# Patient Record
Sex: Female | Born: 1960
Health system: Southern US, Community
[De-identification: ages and names within clinical notes are randomized; demographics above are authoritative.]

## PROBLEM LIST (undated history)

## (undated) DIAGNOSIS — Z720 Tobacco use: Secondary | ICD-10-CM

## (undated) DIAGNOSIS — I10 Essential (primary) hypertension: Secondary | ICD-10-CM

## (undated) DIAGNOSIS — C44519 Basal cell carcinoma of skin of other part of trunk: Secondary | ICD-10-CM

## (undated) DIAGNOSIS — R51 Headache: Secondary | ICD-10-CM

## (undated) DIAGNOSIS — R519 Headache, unspecified: Secondary | ICD-10-CM

## (undated) DIAGNOSIS — R002 Palpitations: Secondary | ICD-10-CM

## (undated) DIAGNOSIS — M1990 Unspecified osteoarthritis, unspecified site: Secondary | ICD-10-CM

## (undated) DIAGNOSIS — R42 Dizziness and giddiness: Secondary | ICD-10-CM

## (undated) DIAGNOSIS — R059 Cough, unspecified: Secondary | ICD-10-CM

## (undated) DIAGNOSIS — L309 Dermatitis, unspecified: Secondary | ICD-10-CM

## (undated) DIAGNOSIS — R05 Cough: Secondary | ICD-10-CM

## (undated) HISTORY — DX: Dizziness and giddiness: R42

## (undated) HISTORY — DX: Cough: R05

## (undated) HISTORY — DX: Headache, unspecified: R51.9

## (undated) HISTORY — DX: Basal cell carcinoma of skin of other part of trunk: C44.519

## (undated) HISTORY — DX: Essential (primary) hypertension: I10

## (undated) HISTORY — DX: Tobacco use: Z72.0

## (undated) HISTORY — DX: Palpitations: R00.2

## (undated) HISTORY — DX: Unspecified osteoarthritis, unspecified site: M19.90

## (undated) HISTORY — PX: LAPAROSCOPY: SHX197

## (undated) HISTORY — PX: ELECTROCARDIOGRAM: SHX264

## (undated) HISTORY — DX: Headache: R51

## (undated) HISTORY — DX: Cough, unspecified: R05.9

## (undated) HISTORY — DX: Dermatitis, unspecified: L30.9

## (undated) HISTORY — PX: KNEE ARTHROSCOPY: SUR90

---

## 1997-11-23 ENCOUNTER — Other Ambulatory Visit: Admission: RE | Admit: 1997-11-23 | Discharge: 1997-11-23 | Payer: Self-pay | Admitting: Obstetrics and Gynecology

## 1998-11-29 ENCOUNTER — Other Ambulatory Visit: Admission: RE | Admit: 1998-11-29 | Discharge: 1998-11-29 | Payer: Self-pay | Admitting: Obstetrics and Gynecology

## 1999-03-18 ENCOUNTER — Ambulatory Visit (HOSPITAL_COMMUNITY): Admission: RE | Admit: 1999-03-18 | Discharge: 1999-03-18 | Payer: Self-pay | Admitting: Obstetrics and Gynecology

## 1999-12-16 ENCOUNTER — Other Ambulatory Visit: Admission: RE | Admit: 1999-12-16 | Discharge: 1999-12-16 | Payer: Self-pay | Admitting: Obstetrics and Gynecology

## 2001-01-07 ENCOUNTER — Other Ambulatory Visit: Admission: RE | Admit: 2001-01-07 | Discharge: 2001-01-07 | Payer: Self-pay | Admitting: Obstetrics and Gynecology

## 2002-03-25 ENCOUNTER — Other Ambulatory Visit: Admission: RE | Admit: 2002-03-25 | Discharge: 2002-03-25 | Payer: Self-pay | Admitting: Obstetrics and Gynecology

## 2003-02-11 ENCOUNTER — Ambulatory Visit (HOSPITAL_COMMUNITY): Admission: RE | Admit: 2003-02-11 | Discharge: 2003-02-11 | Payer: Self-pay | Admitting: Internal Medicine

## 2003-02-11 ENCOUNTER — Encounter: Payer: Self-pay | Admitting: Internal Medicine

## 2003-04-01 ENCOUNTER — Other Ambulatory Visit: Admission: RE | Admit: 2003-04-01 | Discharge: 2003-04-01 | Payer: Self-pay | Admitting: Obstetrics and Gynecology

## 2004-05-02 ENCOUNTER — Other Ambulatory Visit: Admission: RE | Admit: 2004-05-02 | Discharge: 2004-05-02 | Payer: Self-pay | Admitting: Obstetrics and Gynecology

## 2004-07-13 ENCOUNTER — Encounter: Payer: Self-pay | Admitting: Internal Medicine

## 2004-08-30 ENCOUNTER — Emergency Department (HOSPITAL_COMMUNITY): Admission: EM | Admit: 2004-08-30 | Discharge: 2004-08-30 | Payer: Self-pay | Admitting: *Deleted

## 2004-09-01 ENCOUNTER — Emergency Department (HOSPITAL_COMMUNITY): Admission: EM | Admit: 2004-09-01 | Discharge: 2004-09-01 | Payer: Self-pay | Admitting: Emergency Medicine

## 2004-09-02 ENCOUNTER — Ambulatory Visit: Payer: Self-pay | Admitting: Internal Medicine

## 2004-09-02 ENCOUNTER — Ambulatory Visit (HOSPITAL_COMMUNITY): Admission: RE | Admit: 2004-09-02 | Discharge: 2004-09-02 | Payer: Self-pay | Admitting: Internal Medicine

## 2004-09-05 ENCOUNTER — Ambulatory Visit: Payer: Self-pay | Admitting: Internal Medicine

## 2010-12-20 ENCOUNTER — Encounter: Payer: Self-pay | Admitting: Internal Medicine

## 2010-12-23 ENCOUNTER — Encounter: Payer: Self-pay | Admitting: Internal Medicine

## 2010-12-26 ENCOUNTER — Institutional Professional Consult (permissible substitution): Payer: Self-pay | Admitting: Internal Medicine

## 2011-02-06 ENCOUNTER — Encounter: Payer: Self-pay | Admitting: Internal Medicine

## 2011-02-06 ENCOUNTER — Ambulatory Visit (INDEPENDENT_AMBULATORY_CARE_PROVIDER_SITE_OTHER): Payer: Self-pay | Admitting: Internal Medicine

## 2011-02-06 DIAGNOSIS — F17200 Nicotine dependence, unspecified, uncomplicated: Secondary | ICD-10-CM | POA: Insufficient documentation

## 2011-02-06 DIAGNOSIS — Z0000 Encounter for general adult medical examination without abnormal findings: Secondary | ICD-10-CM

## 2011-02-06 DIAGNOSIS — I10 Essential (primary) hypertension: Secondary | ICD-10-CM

## 2011-02-06 MED ORDER — AMLODIPINE BESYLATE 2.5 MG PO TABS: 2.5000 mg | ORAL_TABLET | Freq: Two times a day (BID) | ORAL | Status: DC

## 2011-02-06 NOTE — Progress Notes (Signed)
HPI Patient is a 50 year old who was referred for evaluation of hypertension She was seen a [facility].  BP 170s/  Placed on 12.5 HCTZ. Has BP cuff at home  Having problems with it   BP was 180/ at one point BP has been up over several visits int past Notes rare dizziness with heat.  Occasional palpitations  Short lived. Allergies  Allergen Reactions  . Cannabinoids   . Tetracyclines & Related     Current Outpatient Prescriptions  Medication Sig Dispense Refill  . B Complex-C (SUPER B COMPLEX PO) Take by mouth. daily       . Calcium Carbonate-Vit D-Min (CALTRATE PLUS PO) Take by mouth.        . clonazePAM (KLONOPIN) 0.5 MG tablet Pt takes as needed      . Fish Oil OIL        . hydrochlorothiazide (,MICROZIDE/HYDRODIURIL,) 12.5 MG capsule 1 tab daily        Past Medical History  Diagnosis Date  . Hypertension   . Dizziness - light-headed   . Chest pain   . Palpitations   . Tobacco abuse     No ready to quit  . Cough     Past Surgical History  Procedure Date  . Electrocardiogram     Family History  Problem Relation Age of Onset  . Hypertension Mother   . Hypertension Sister     History   Social History  . Marital Status: Married    Spouse Name: N/A    Number of Children: N/A  . Years of Education: N/A   Occupational History  . Works for Adult nurse    Social History Main Topics  . Smoking status: Current Everyday Smoker -- 0.5 packs/day  . Smokeless tobacco: Not on file  . Alcohol Use: 1.5 - 2.0 oz/week    3-4 drink(s) per week  . Drug Use: Not on file  . Sexually Active: Not on file   Other Topics Concern  . Not on file   Social History Narrative   Married    Review of Systems:  All systems reviewed.  They are negative to the above problem except as previously stated. Worried about wt loss was 115  Now 109  Blames on heat. Vital Signs: BP 162/104  Pulse 83  Ht 5\' 4"  (1.626 m)  Wt 109 lb (49.442 kg)  BMI 18.71  kg/m2  Physical Exam  Patient is in NAD HEENT:  Normocephalic, atraumatic. EOMI, PERRLA.  Neck: JVP is normal. No thyromegaly. No bruits.  Lungs: clear to auscultation. No rales no wheezes.  Heart: Regular rate and rhythm. Normal S1, S2. No S3.   No significant murmurs. PMI not displaced.  Abdomen:  Supple, nontender. Normal bowel sounds. No masses. No hepatomegaly.  Extremities:   Good distal pulses throughout. No lower extremity edema.  Musculoskeletal :moving all extremities.  Neuro:   alert and oriented x3.  CN II-XII grossly intact.  EKG:  NSR.  85 bpm.  Short PR.   Assessment and Plan:

## 2011-02-06 NOTE — Assessment & Plan Note (Signed)
Will see if she has had fasting lipids.  Will need to be followed.

## 2011-02-06 NOTE — Assessment & Plan Note (Signed)
Will start Norvasc 2.5.  Can increase as neede Continue HCTZ for now Will get the additional labs she had at [facility]

## 2011-02-06 NOTE — Patient Instructions (Signed)
Start Norvasc 2.5mg  2 times per day  See Dr.Ross in 4 weeks.

## 2011-02-06 NOTE — Assessment & Plan Note (Signed)
Counselled on quitting. 

## 2011-03-06 ENCOUNTER — Ambulatory Visit (INDEPENDENT_AMBULATORY_CARE_PROVIDER_SITE_OTHER): Payer: BC Managed Care – PPO | Admitting: Internal Medicine

## 2011-03-06 ENCOUNTER — Encounter: Payer: Self-pay | Admitting: Internal Medicine

## 2011-03-06 DIAGNOSIS — I10 Essential (primary) hypertension: Secondary | ICD-10-CM

## 2011-03-06 DIAGNOSIS — F17200 Nicotine dependence, unspecified, uncomplicated: Secondary | ICD-10-CM

## 2011-03-06 DIAGNOSIS — Z0000 Encounter for general adult medical examination without abnormal findings: Secondary | ICD-10-CM

## 2011-03-06 NOTE — Progress Notes (Signed)
HPI Patient is a 50 year old who I saw back in July  She was referred for hypertension.  I added low dose norvasc to her regimen. Feeling good.  Hasnt taken BP.  Breathing ok  Active.  No edema.   Allergies  Allergen Reactions  . Cannabinoids   . Tetracyclines & Related     Current Outpatient Prescriptions  Medication Sig Dispense Refill  . amLODipine (NORVASC) 2.5 MG tablet Take 2.5 mg by mouth daily.        . B Complex-C (SUPER B COMPLEX PO) Take by mouth. daily       . Calcium Carbonate-Vit D-Min (CALTRATE PLUS PO) Take by mouth.        . clonazePAM (KLONOPIN) 0.5 MG tablet Pt takes as needed      . Fish Oil OIL          Past Medical History  Diagnosis Date  . Hypertension   . Dizziness - light-headed   . Chest pain   . Palpitations   . Tobacco abuse     No ready to quit  . Cough     Past Surgical History  Procedure Date  . Electrocardiogram     Family History  Problem Relation Age of Onset  . Hypertension Mother   . Hypertension Sister     History   Social History  . Marital Status: Married    Spouse Name: N/A    Number of Children: N/A  . Years of Education: N/A   Occupational History  . Works for Adult nurse    Social History Main Topics  . Smoking status: Current Everyday Smoker -- 0.5 packs/day  . Smokeless tobacco: Not on file  . Alcohol Use: 1.5 - 2.0 oz/week    3-4 drink(s) per week  . Drug Use: Not on file  . Sexually Active: Not on file   Other Topics Concern  . Not on file   Social History Narrative   Married    Review of Systems:  All systems reviewed.  They are negative to the above problem except as previously stated.  Vital Signs: BP 141/92  Pulse 92  Ht 5\' 4"  (1.626 m)  Wt 109 lb (49.442 kg)  BMI 18.71 kg/m2  BP on my check was 126/86  Physical Exam  HEENT:  Normocephalic, atraumatic. EOMI, PERRLA.  Neck: JVP is normal. No thyromegaly. No bruits.  Lungs: clear to auscultation. No rales no wheezes.  Heart:  Regular rate and rhythm. Normal S1, S2. No S3.   No significant murmurs. PMI not displaced.  Abdomen:  Supple, nontender. Normal bowel sounds. No masses. No hepatomegaly.  Extremities:   Good distal pulses throughout. No lower extremity edema.  Musculoskeletal :moving all extremities.  Neuro:   alert and oriented x3.  CN II-XII grossly intact.   Assessment and Plan:

## 2011-03-06 NOTE — Assessment & Plan Note (Signed)
Will need lipids periodically.

## 2011-03-06 NOTE — Assessment & Plan Note (Signed)
BP is better than last visit.  She may need a little more.  I rec that she check BP at home.  Get cuff calibrated at pharmacy. If running greater than 150 over 90 then increase to 2.5 bid amlodipine She would like to f/u at [facility] due to lower copay.  I will be available as needed.

## 2011-03-06 NOTE — Assessment & Plan Note (Signed)
Trying to quit.  Encouraged.  

## 2011-07-21 ENCOUNTER — Ambulatory Visit (INDEPENDENT_AMBULATORY_CARE_PROVIDER_SITE_OTHER): Payer: BC Managed Care – PPO | Admitting: Family Medicine

## 2011-07-21 DIAGNOSIS — I10 Essential (primary) hypertension: Secondary | ICD-10-CM

## 2011-08-18 ENCOUNTER — Ambulatory Visit (INDEPENDENT_AMBULATORY_CARE_PROVIDER_SITE_OTHER): Payer: BC Managed Care – PPO | Admitting: Family Medicine

## 2011-08-18 DIAGNOSIS — M25549 Pain in joints of unspecified hand: Secondary | ICD-10-CM

## 2011-08-18 DIAGNOSIS — M654 Radial styloid tenosynovitis [de Quervain]: Secondary | ICD-10-CM

## 2011-09-27 ENCOUNTER — Telehealth: Payer: Self-pay

## 2011-09-27 ENCOUNTER — Ambulatory Visit (INDEPENDENT_AMBULATORY_CARE_PROVIDER_SITE_OTHER): Payer: BC Managed Care – PPO | Admitting: Family Medicine

## 2011-09-27 ENCOUNTER — Telehealth: Payer: Self-pay | Admitting: Family Medicine

## 2011-09-27 DIAGNOSIS — M542 Cervicalgia: Secondary | ICD-10-CM

## 2011-09-27 DIAGNOSIS — R599 Enlarged lymph nodes, unspecified: Secondary | ICD-10-CM

## 2011-09-27 DIAGNOSIS — R590 Localized enlarged lymph nodes: Secondary | ICD-10-CM

## 2011-09-27 LAB — POCT CBC
Granulocyte percent: 70.5 %G (ref 37–80)
Hemoglobin: 12.9 g/dL (ref 12.2–16.2)
MID (cbc): 0.6 (ref 0–0.9)
MPV: 8.2 fL (ref 0–99.8)
POC MID %: 6.4 %M (ref 0–12)
Platelet Count, POC: 356 10*3/uL (ref 142–424)
RBC: 4.4 M/uL (ref 4.04–5.48)

## 2011-09-27 MED ORDER — AZITHROMYCIN 250 MG PO TABS: ORAL_TABLET | ORAL | Status: AC

## 2011-09-27 MED ORDER — HYDROCODONE-ACETAMINOPHEN 5-325 MG PO TABS: 2.0000 | ORAL_TABLET | Freq: Four times a day (QID) | ORAL | Status: DC | PRN

## 2011-09-27 NOTE — Patient Instructions (Signed)
Take antibiotic as instructed, 1 to 2 lortab up to every 6 hours as needed.  If any fever, rash, or worsening - return to clinic or emergency room.  Recheck at office visit in 10 days.  Lymphadenopathy Lymphadenopathy means "disease of the lymph glands." But the term is usually used to describe swollen or enlarged lymph glands, also called lymph nodes. These are the bean-shaped organs found in many locations including the neck, underarm, and groin. Lymph glands are part of the immune system, which fights infections in your body. Lymphadenopathy can occur in just one area of the body, such as the neck, or it can be generalized, with lymph node enlargement in several areas. The nodes found in the neck are the most common sites of lymphadenopathy. CAUSES  When your immune system responds to germs (such as viruses or bacteria ), infection-fighting cells and fluid build up. This causes the glands to grow in size. This is usually not something to worry about. Sometimes, the glands themselves can become infected and inflamed. This is called lymphadenitis. Enlarged lymph nodes can be caused by many diseases:  Bacterial disease, such as strep throat or a skin infection.   Viral disease, such as a common cold.   Other germs, such as lyme disease, tuberculosis, or sexually transmitted diseases.   Cancers, such as lymphoma (cancer of the lymphatic system) or leukemia (cancer of the white blood cells).   Inflammatory diseases such as lupus or rheumatoid arthritis.   Reactions to medications.  Many of the diseases above are rare, but important. This is why you should see your caregiver if you have lymphadenopathy. SYMPTOMS   Swollen, enlarged lumps in the neck, back of the head or other locations.   Tenderness.   Warmth or redness of the skin over the lymph nodes.   Fever.  DIAGNOSIS  Enlarged lymph nodes are often near the source of infection. They can help healthcare providers diagnose your  illness. For instance:   Swollen lymph nodes around the jaw might be caused by an infection in the mouth.   Enlarged glands in the neck often signal a throat infection.   Lymph nodes that are swollen in more than one area often indicate an illness caused by a virus.  Your caregiver most likely will know what is causing your lymphadenopathy after listening to your history and examining you. Blood tests, x-rays or other tests may be needed. If the cause of the enlarged lymph node cannot be found, and it does not go away by itself, then a biopsy may be needed. Your caregiver will discuss this with you. TREATMENT  Treatment for your enlarged lymph nodes will depend on the cause. Many times the nodes will shrink to normal size by themselves, with no treatment. Antibiotics or other medicines may be needed for infection. Only take over-the-counter or prescription medicines for pain, discomfort or fever as directed by your caregiver. HOME CARE INSTRUCTIONS  Swollen lymph glands usually return to normal when the underlying medical condition goes away. If they persist, contact your health-care provider. He/she might prescribe antibiotics or other treatments, depending on the diagnosis. Take any medications exactly as prescribed. Keep any follow-up appointments made to check on the condition of your enlarged nodes.  SEEK MEDICAL CARE IF:   Swelling lasts for more than two weeks.   You have symptoms such as weight loss, night sweats, fatigue or fever that does not go away.   The lymph nodes are hard, seem fixed to the skin  or are growing rapidly.   Skin over the lymph nodes is red and inflamed. This could mean there is an infection.  SEEK IMMEDIATE MEDICAL CARE IF:   Fluid starts leaking from the area of the enlarged lymph node.   You develop a fever of 102 F (38.9 C) or greater.   Severe pain develops (not necessarily at the site of a large lymph node).   You develop chest pain or shortness of  breath.   You develop worsening abdominal pain.  MAKE SURE YOU:   Understand these instructions.   Will watch your condition.   Will get help right away if you are not doing well or get worse.  Document Released: 04/18/2008 Document Revised: 06/29/2011 Document Reviewed: 04/18/2008 [facility] Patient Information 2012 [place], [place].

## 2011-09-27 NOTE — Progress Notes (Signed)
Subjective:    Patient ID: Kellie Mason, female    DOB: 02/24/1961, 51 y.o.   MRN: [id number]  HPI Kellie Mason is a 51 y.o. female Started 3 nights ago - bumps on left side of neck and back of head. L side only. No fever, no sore throat.  Head and chest cold 2 months ago - feeling fine recently. Fatigue due to pain in neck and hard to sleep on L side.    Tx:  None.  2 cats at home, no recent scratches.   Review of Systems  Constitutional: Negative for fever, chills, activity change and appetite change.  HENT: Positive for neck pain and neck stiffness. Negative for hearing loss, ear pain, congestion, sore throat, facial swelling, rhinorrhea, trouble swallowing, postnasal drip and ear discharge.   Eyes: Negative for photophobia, discharge and itching.  Respiratory: Negative for cough and shortness of breath.   Skin: Negative for rash.  Hematological: Positive for adenopathy.       Objective:   Physical Exam  Constitutional: She is oriented to person, place, and time. She appears well-developed.  HENT:  Head: Normocephalic and atraumatic.  Right Ear: External ear normal.  Left Ear: External ear normal.  Mouth/Throat: Oropharynx is clear and moist. No oropharyngeal exudate.  Eyes: Conjunctivae are normal. Pupils are equal, round, and reactive to light.  Neck: Trachea normal. Neck supple. No mass and no thyromegaly present.    Cardiovascular: Normal rate and regular rhythm.   Pulmonary/Chest: Effort normal.  Lymphadenopathy:    She has cervical adenopathy.  Neurological: She is alert and oriented to person, place, and time.  Skin: Skin is warm and dry.  Psychiatric: She has a normal mood and affect. Her behavior is normal.  able to move neck with difficulty, but painful on L side with LAD.    No supraclavicular, epitrochlear LAD.  No rash or abrasions/lacerations on neck or arm noted.  No wounds.  Results for orders placed in visit on 09/27/11  POCT RAPID STREP A  (OFFICE)      Component Value Range   Rapid Strep A Screen Negative  Negative   POCT CBC      Component Value Range   WBC 9.2  4.6 - 10.2 (K/uL)   Lymph, poc 2.1  0.6 - 3.4    POC LYMPH PERCENT 23.1  10 - 50 (%L)   MID (cbc) 0.6  0 - 0.9    POC MID % 6.4  0 - 12 (%M)   POC Granulocyte 6.5  2 - 6.9    Granulocyte percent 70.5  37 - 80 (%G)   RBC 4.40  4.04 - 5.48 (M/uL)   Hemoglobin 12.9  12.2 - 16.2 (g/dL)   HCT, POC 78.2  95.6 - 47.9 (%)   MCV 92.4  80 - 97 (fL)   MCH, POC 29.3  27 - 31.2 (pg)   MCHC 31.7 (*) 31.8 - 35.4 (g/dL)   RDW, POC 21.3     Platelet Count, POC 356  142 - 424 (K/uL)   MPV 8.2  0 - 99.8 (fL)        Assessment & Plan:  Kellie Mason is a 51 y.o. female  1. Lymphadenopathy with neck pain on left.  DDX viral (including EBV, CMV) vs. Bartonella inf'n as cats at home, but no wounds seen. Start Zpak #1, lortab q6hprn pain,  Heat or ice to area prn, and recheck in 7-10 days if not resolving.  If any  worsening sooner including fever or rash, rtc for eval.  Has tolerated percocet in past without rash.  Advised if any rash or new side effects with lortab, stop med and rtc or er.

## 2011-09-27 NOTE — Telephone Encounter (Signed)
Norco rx printed and not given to patient, ok per Dr. Neva Seat to call into [place] [facility] on [address].  Done.

## 2011-10-06 ENCOUNTER — Encounter: Payer: Self-pay | Admitting: Family Medicine

## 2011-10-06 ENCOUNTER — Ambulatory Visit (INDEPENDENT_AMBULATORY_CARE_PROVIDER_SITE_OTHER): Payer: BC Managed Care – PPO | Admitting: Family Medicine

## 2011-10-06 VITALS — BP 141/87 | HR 93 | Temp 96.8°F | Resp 18 | Ht 64.5 in | Wt 112.0 lb

## 2011-10-06 DIAGNOSIS — L309 Dermatitis, unspecified: Secondary | ICD-10-CM

## 2011-10-06 DIAGNOSIS — L259 Unspecified contact dermatitis, unspecified cause: Secondary | ICD-10-CM

## 2011-10-06 DIAGNOSIS — R599 Enlarged lymph nodes, unspecified: Secondary | ICD-10-CM

## 2011-10-06 DIAGNOSIS — R591 Generalized enlarged lymph nodes: Secondary | ICD-10-CM

## 2011-10-06 DIAGNOSIS — M6740 Ganglion, unspecified site: Secondary | ICD-10-CM

## 2011-10-06 MED ORDER — CLOBETASOL PROPIONATE 0.05 % EX CREA: TOPICAL_CREAM | Freq: Two times a day (BID) | CUTANEOUS | Status: DC

## 2011-10-06 NOTE — Progress Notes (Signed)
  Subjective:    Patient ID: Donzetta Matters, female    DOB: [birth year], 51 y.o.   MRN: [id number]  HPI LEEANN BADY is a 51 y.o. female Follow up  Lymphadenopathy with neck pain on left - evaluated on 09/27/11.  DDX viral (including EBV, CMV) vs. Bartonella inf'n as cats at home, but no wounds seen. Started Zpak #1, lortab q6hprn pain,  Heat or ice to area prn.  Bump went down on neck - resolved totally sice yesterday,  Soreness almost completely gone.  No fever.  R ganglion cyst. - still small amount of swelling after incised by hand.  Hx eczema - dermatologist - Dr. Londell Moh or Yetta Barre at [facility].  Uses clobetasol proprionate 0.05% in am, and 3 nights per week during flair.  Has 60 gram tube.    Review of Systems  Constitutional: Negative for fever and chills.  HENT: Negative for neck pain and neck stiffness.   Skin:       Dry skin on hands feet.  Hematological: Negative for adenopathy.       Objective:   Physical Exam  Constitutional: She appears well-developed and well-nourished.  HENT:  Head: Normocephalic.  Pulmonary/Chest: Effort normal.  Musculoskeletal:       Hands: Lymphadenopathy:    She has no cervical adenopathy.  Skin:       Dry, scale with few cracks in skin volar wrist/hands,  And lateral foot.  No surrounding erythema.    No lymphadenopathy or focal ttp. In neck good rom.      Assessment & Plan:  RHIANA MORASH is a 51 y.o. female  Lymphadenopathy - resolved.  rtc if sx's recur.  R hand gangion cyst - incised 3 weeks ago.  still some residual swelling - plans on emailing Dr. Melvyn Novas to follow up.  Eczema - hands, feet.  Dyshidrosis?  Frequent hand washing at work - 15 times.  lubriderm or eucerin throughout day and overnight.  Clobetasol 0.05% BID prn - short courses only to not exceed 5-7 days, or follow up with derm.  Discussed risk hyperpigmentation with steroid use.RTC precautions.  At end of visit - admitted to insomnia .  Sleep hygiene  discussed, can follow up to discuss in more detail and needs HTN follow up next few months.

## 2011-11-09 NOTE — Telephone Encounter (Signed)
test

## 2012-01-08 ENCOUNTER — Other Ambulatory Visit: Payer: Self-pay | Admitting: Family Medicine

## 2012-01-19 ENCOUNTER — Ambulatory Visit (INDEPENDENT_AMBULATORY_CARE_PROVIDER_SITE_OTHER): Payer: PRIVATE HEALTH INSURANCE | Admitting: Family Medicine

## 2012-01-19 ENCOUNTER — Encounter: Payer: Self-pay | Admitting: Family Medicine

## 2012-01-19 VITALS — BP 136/83 | HR 81 | Temp 97.2°F | Resp 16 | Ht 64.5 in | Wt 111.2 lb

## 2012-01-19 DIAGNOSIS — R42 Dizziness and giddiness: Secondary | ICD-10-CM

## 2012-01-19 DIAGNOSIS — I10 Essential (primary) hypertension: Secondary | ICD-10-CM

## 2012-01-19 DIAGNOSIS — J301 Allergic rhinitis due to pollen: Secondary | ICD-10-CM

## 2012-01-19 DIAGNOSIS — R05 Cough: Secondary | ICD-10-CM

## 2012-01-19 DIAGNOSIS — R059 Cough, unspecified: Secondary | ICD-10-CM

## 2012-01-19 LAB — CBC WITH DIFFERENTIAL/PLATELET
Eosinophils Relative: 1 % (ref 0–5)
HCT: 38.3 % (ref 36.0–46.0)
Hemoglobin: 13.1 g/dL (ref 12.0–15.0)
Lymphocytes Relative: 27 % (ref 12–46)
Lymphs Abs: 2.1 10*3/uL (ref 0.7–4.0)
MCH: 30.5 pg (ref 26.0–34.0)
MCV: 89.3 fL (ref 78.0–100.0)
Monocytes Absolute: 0.7 10*3/uL (ref 0.1–1.0)
Monocytes Relative: 8 % (ref 3–12)
Platelets: 426 10*3/uL — ABNORMAL HIGH (ref 150–400)
RBC: 4.29 MIL/uL (ref 3.87–5.11)
WBC: 8.1 10*3/uL (ref 4.0–10.5)

## 2012-01-19 LAB — BASIC METABOLIC PANEL
CO2: 27 mEq/L (ref 19–32)
Chloride: 102 mEq/L (ref 96–112)
Potassium: 4 mEq/L (ref 3.5–5.3)
Sodium: 140 mEq/L (ref 135–145)

## 2012-01-19 MED ORDER — HYDROCHLOROTHIAZIDE 12.5 MG PO CAPS: 12.5000 mg | ORAL_CAPSULE | ORAL | Status: DC

## 2012-01-19 MED ORDER — ALBUTEROL SULFATE HFA 108 (90 BASE) MCG/ACT IN AERS: 2.0000 | INHALATION_SPRAY | Freq: Four times a day (QID) | RESPIRATORY_TRACT | Status: DC | PRN

## 2012-01-19 MED ORDER — AMLODIPINE BESYLATE 2.5 MG PO TABS: 2.5000 mg | ORAL_TABLET | Freq: Every day | ORAL | Status: DC

## 2012-01-19 NOTE — Progress Notes (Signed)
  Subjective:    Patient ID: Kellie Mason, female    DOB: 10/23/1960, 51 y.o.   MRN: [id number]  HPI HTN - no outside BPs.  Dizzy at times at work - 2-3 mornings per week - few months.  Not climpbing ladders.  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  Lasts 20-30 seconds.   Has not checked pressure during episodes.  Drinking plenty of water during day. No dark urine, normal uop.  Borderline anemia in past.  Hgb 12.9 on March 6th. Eating breakfast QD.  TSH wnl 5/12.  Cough - past 2-3 weeks, but improving.  No fever.  Dust in warehouse with inventory.  Wheeze in past.  No hx asthma.  Slight sore throat past few weeks - better now.  Tx: alka seltzer sever cold.     Review of Systems  Constitutional: Negative for fatigue and unexpected weight change.       Down 3 pounds with work environment.    Respiratory: Negative for chest tightness and shortness of breath.   Cardiovascular: Negative for chest pain, palpitations and leg swelling.  Gastrointestinal: Negative for abdominal pain and blood in stool.  Neurological: Positive for dizziness. Negative for syncope, light-headedness and headaches.       Per hpi.       Objective:   Physical Exam  Constitutional: She is oriented to person, place, and time. She appears well-developed.       Thin, but not cachectic.  HENT:  Head: Normocephalic and atraumatic.  Eyes: Conjunctivae and EOM are normal. Pupils are equal, round, and reactive to light.  Neck: Carotid bruit is not present.  Cardiovascular: Normal rate, regular rhythm, normal heart sounds and intact distal pulses.   Pulmonary/Chest: Effort normal and breath sounds normal.  Abdominal: Soft. She exhibits no pulsatile midline mass. There is no tenderness.  Neurological: She is alert and oriented to person, place, and time.  Skin: Skin is warm and dry.  Psychiatric: She has a normal mood and affect. Her behavior is normal.          Assessment & Plan:  Kellie Mason is a 51 y.o.  female   Htn with episodes of dizziness. Cont same meds for now as seems to be associated with heat of warehouse, Check outside BP's - if less than 110 during episodes, stop norvasc and rtc.  Recheck in next 6-8 weeks with outside BP's. If dizziness not resolved  Discussed increase in calories, and complex carbs during workday to maintain during workday.  Check BMP, TSH, CBC.  Cough, ST - improved. Suspect allergic component with dust exposure Trial of allegra or zyrtec otc QD, and only if needed - proair for wheeze.    rtc precautions.  Patient Instructions  Try zyrtec or allegra - 1 per day for allergies.  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 If wheezing - can use inhaler as prescribed.  Increase calories during workday, maintain adequate hydration, and if dizziness - check blood pressure.  If these symptoms persist - recheck in next 6 weeks, sooner if worse. Your should receive a call or letter about your lab results within the next week to 10 days.  Return to the clinic or go to the nearest emergency room if any of your symptoms worsen or new symptoms occur.

## 2012-01-19 NOTE — Patient Instructions (Addendum)
Try zyrtec or allegra - 1 per day for allergies.  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 If wheezing - can use inhaler as prescribed.  Increase calories during workday, maintain adequate hydration, and if dizziness - check blood pressure.  If these symptoms persist - recheck in next 6 weeks, sooner if worse. Your should receive a call or letter about your lab results within the next week to 10 days.  Return to the clinic or go to the nearest emergency room if any of your symptoms worsen or new symptoms occur.

## 2012-03-29 ENCOUNTER — Ambulatory Visit: Payer: PRIVATE HEALTH INSURANCE | Admitting: Family Medicine

## 2012-04-12 ENCOUNTER — Ambulatory Visit (INDEPENDENT_AMBULATORY_CARE_PROVIDER_SITE_OTHER): Payer: PRIVATE HEALTH INSURANCE | Admitting: Family Medicine

## 2012-04-12 ENCOUNTER — Encounter: Payer: Self-pay | Admitting: Family Medicine

## 2012-04-12 VITALS — BP 141/84 | HR 73 | Temp 98.6°F | Resp 16 | Ht 64.75 in | Wt 113.4 lb

## 2012-04-12 DIAGNOSIS — D473 Essential (hemorrhagic) thrombocythemia: Secondary | ICD-10-CM

## 2012-04-12 DIAGNOSIS — D75839 Thrombocytosis, unspecified: Secondary | ICD-10-CM

## 2012-04-12 DIAGNOSIS — L309 Dermatitis, unspecified: Secondary | ICD-10-CM

## 2012-04-12 DIAGNOSIS — D696 Thrombocytopenia, unspecified: Secondary | ICD-10-CM

## 2012-04-12 DIAGNOSIS — B353 Tinea pedis: Secondary | ICD-10-CM

## 2012-04-12 DIAGNOSIS — L209 Atopic dermatitis, unspecified: Secondary | ICD-10-CM | POA: Insufficient documentation

## 2012-04-12 DIAGNOSIS — I10 Essential (primary) hypertension: Secondary | ICD-10-CM

## 2012-04-12 LAB — CBC WITH DIFFERENTIAL/PLATELET
Basophils Relative: 0 % (ref 0–1)
Eosinophils Relative: 2 % (ref 0–5)
HCT: 37.8 % (ref 36.0–46.0)
Hemoglobin: 12.7 g/dL (ref 12.0–15.0)
MCHC: 33.6 g/dL (ref 30.0–36.0)
MCV: 93.1 fL (ref 78.0–100.0)
Monocytes Absolute: 0.6 10*3/uL (ref 0.1–1.0)
Monocytes Relative: 7 % (ref 3–12)
Neutro Abs: 4.9 10*3/uL (ref 1.7–7.7)

## 2012-04-12 MED ORDER — FLUCONAZOLE 150 MG PO TABS: 150.0000 mg | ORAL_TABLET | Freq: Once | ORAL | Status: DC

## 2012-04-12 MED ORDER — CLOBETASOL PROPIONATE 0.05 % EX CREA: TOPICAL_CREAM | Freq: Two times a day (BID) | CUTANEOUS | Status: AC

## 2012-04-12 NOTE — Progress Notes (Signed)
Subjective:    Patient ID: Kellie Mason, female    DOB: 10-26-1960, 51 y.o.   MRN: [id number]  HPI Kellie Mason is a 51 y.o. female Here for follow up .  HTN - outside BP's - machine inconsistent - trouble with machine. No outside readings.   Last creatinine. No recent problems with dizziness. Only rare if not eating.  Thrombocytosis - borderline - last labs: Results for orders placed in visit on 01/19/12  BASIC METABOLIC PANEL      Component Value Range   Sodium 140  135 - 145 mEq/L   Potassium 4.0  3.5 - 5.3 mEq/L   Chloride 102  96 - 112 mEq/L   CO2 27  19 - 32 mEq/L   Glucose, Bld 69 (*) 70 - 99 mg/dL   BUN 17  6 - 23 mg/dL   Creat 4.09  8.11 - 9.14 mg/dL   Calcium 9.8  8.4 - 78.2 mg/dL  TSH      Component Value Range   TSH 1.856  0.350 - 4.500 uIU/mL  CBC WITH DIFFERENTIAL      Component Value Range   WBC 8.1  4.0 - 10.5 K/uL   RBC 4.29  3.87 - 5.11 MIL/uL   Hemoglobin 13.1  12.0 - 15.0 g/dL   HCT 95.6  21.3 - 08.6 %   MCV 89.3  78.0 - 100.0 fL   MCH 30.5  26.0 - 34.0 pg   MCHC 34.2  30.0 - 36.0 g/dL   RDW 57.8  46.9 - 62.9 %   Platelets 426 (*) 150 - 400 K/uL   Neutrophils Relative 64  43 - 77 %   Neutro Abs 5.2  1.7 - 7.7 K/uL   Lymphocytes Relative 27  12 - 46 %   Lymphs Abs 2.1  0.7 - 4.0 K/uL   Monocytes Relative 8  3 - 12 %   Monocytes Absolute 0.7  0.1 - 1.0 K/uL   Eosinophils Relative 1  0 - 5 %   Eosinophils Absolute 0.1  0.0 - 0.7 K/uL   Basophils Relative 0  0 - 1 %   Basophils Absolute 0.0  0.0 - 0.1 K/uL   Smear Review Criteria for review not met    Does smoke 1/2 ppd or less.   Foot fungus?  Went to [employer] last week.  Intermittent pain - thought to be eczema or fungus. Sx's on and off for years.  Sand caused more pain.  Tried lamisil otc - heloing some.  Feet still sore. Tried mutiple otc treatments - only intermittent relief.  lamisil seems to help the most - twice per day past week only. Steroids not helping in past.    Eczema - hands -   Used temovate in past - needs refill. Dog ate cream.    Review of Systems  Constitutional: Negative for fatigue and unexpected weight change.  Respiratory: Negative for chest tightness and shortness of breath.   Cardiovascular: Negative for chest pain, palpitations and leg swelling.  Gastrointestinal: Negative for abdominal pain and blood in stool.  Skin: Positive for rash.  Neurological: Negative for dizziness, syncope, light-headedness and headaches.       Objective:   Physical Exam  Constitutional: She is oriented to person, place, and time. She appears well-developed and well-nourished.  HENT:  Head: Normocephalic and atraumatic.  Eyes: Conjunctivae normal and EOM are normal. Pupils are equal, round, and reactive to light.  Neck: Carotid bruit is not present.  Cardiovascular: Normal rate, regular rhythm, normal heart sounds and intact distal pulses.   Pulmonary/Chest: Effort normal and breath sounds normal.  Abdominal: Soft. She exhibits no pulsatile midline mass. There is no tenderness.  Neurological: She is alert and oriented to person, place, and time.  Skin: Skin is warm and dry. Lesion noted. No rash noted.       Dry cracked skin on palmar hands bilaterally.   Dry cracked skin on medial heels and medial feet bilaterally and few dry cracked areas between toes. No discharge or maceration.   Psychiatric: She has a normal mood and affect. Her behavior is normal.        Assessment & Plan:  Kellie Mason is a 51 y.o. female 1. Thrombocytosis  CBC with Differential  2. HTN (hypertension)    3. Eczema of hand  clobetasol cream (TEMOVATE) 0.05 %  4. Tinea pedis  fluconazole (DIFLUCAN) 150 MG tablet   HTN - borderline.  Check outside bp's and continue same doses for now.   Eczema - hands - refilled temovate.  Rash on feet - azole responsive suggests tinea.  Cont otc antifungal BID, and added 2 doses of Diflucan - 150 mg x1 now and repeat in 1 week.   Thrombocytosis  -  recheck CBC.  Follow up in 3 months with outside BP's.

## 2012-08-01 ENCOUNTER — Other Ambulatory Visit: Payer: Self-pay | Admitting: Dermatology

## 2012-08-08 ENCOUNTER — Other Ambulatory Visit: Payer: Self-pay | Admitting: Family Medicine

## 2012-10-03 ENCOUNTER — Encounter: Payer: Self-pay | Admitting: Family Medicine

## 2012-10-14 ENCOUNTER — Telehealth: Payer: Self-pay

## 2012-10-14 MED ORDER — HYDROCHLOROTHIAZIDE 12.5 MG PO CAPS: 12.5000 mg | ORAL_CAPSULE | ORAL | Status: DC

## 2012-10-14 MED ORDER — AMLODIPINE BESYLATE 2.5 MG PO TABS: 2.5000 mg | ORAL_TABLET | Freq: Every day | ORAL | Status: DC

## 2012-10-14 NOTE — Telephone Encounter (Signed)
Patient advised since Dec she needs office visit she is requesting HCTZ and Amlodipine renewals. Please advise.

## 2012-10-14 NOTE — Telephone Encounter (Signed)
I will authorize a 1 month supply. This will be the last refill without an OV.

## 2012-10-14 NOTE — Telephone Encounter (Signed)
Patient says she is a patient of Dr. Chilton Si and has ran out of her blood pressure medication and water pill. She wants to know if this can be refilled because she says she has no time to come to our office, and is no way able to. She says she can not make it to the walk-in. Please advise if she can have a refill or temporary supply till she is seen? Please call patient if needed: best time is after 5pm today due to work: [phone]

## 2012-10-14 NOTE — Telephone Encounter (Signed)
Patient advised.

## 2012-11-18 ENCOUNTER — Ambulatory Visit (INDEPENDENT_AMBULATORY_CARE_PROVIDER_SITE_OTHER): Payer: PRIVATE HEALTH INSURANCE | Admitting: Family Medicine

## 2012-11-18 ENCOUNTER — Encounter: Payer: Self-pay | Admitting: Family Medicine

## 2012-11-18 VITALS — BP 120/86 | HR 66 | Temp 98.9°F | Resp 16 | Ht 65.0 in | Wt 119.2 lb

## 2012-11-18 DIAGNOSIS — I10 Essential (primary) hypertension: Secondary | ICD-10-CM

## 2012-11-18 DIAGNOSIS — Z85828 Personal history of other malignant neoplasm of skin: Secondary | ICD-10-CM | POA: Insufficient documentation

## 2012-11-18 DIAGNOSIS — Z23 Encounter for immunization: Secondary | ICD-10-CM

## 2012-11-18 LAB — BASIC METABOLIC PANEL
BUN: 14 mg/dL (ref 6–23)
Chloride: 104 mEq/L (ref 96–112)
Glucose, Bld: 82 mg/dL (ref 70–99)
Potassium: 4.1 mEq/L (ref 3.5–5.3)

## 2012-11-18 MED ORDER — AMLODIPINE BESYLATE 2.5 MG PO TABS: 2.5000 mg | ORAL_TABLET | Freq: Every day | ORAL | Status: DC

## 2012-11-18 MED ORDER — HYDROCHLOROTHIAZIDE 12.5 MG PO CAPS: 12.5000 mg | ORAL_CAPSULE | ORAL | Status: DC

## 2012-11-18 NOTE — Patient Instructions (Signed)
Your should receive a call or letter about your lab results within the next week to 10 days.  Continue hydrochlorothiazide, but can hold on restart of amlodipine as long as your outside blood pressures do not remain above 140/90 (buy new blood pressure cuff that goes around upper arm or use one at the local pharmacy or [facility] or [facility])   Restart this medicine if they do go back up.  Return to the clinic or go to the nearest emergency room if any of your symptoms worsen or new symptoms occur. Plan on physical in next 6 months.  Keep up the good work with quitting smoking.  Let me know if I can help in this regard.

## 2012-11-18 NOTE — Progress Notes (Signed)
Subjective:    Patient ID: Kellie Mason, female    DOB: 02-04-1961, 52 y.o.   MRN: [id number]  HPI Kellie Mason is a 52 y.o. female  Hx of HTN - last OV in September of last year. See telephone messages in regards to refills. Borderline at last ov - planned same doses, but to bring in outside readings. Still getting varied readings on home machine (wrist machine) -  some 180/105, then other times 160/98.  Does not seem to correlate with readings here.  Would like trial off one of meds. Out of meds for past 2 days, then occasional skipped days.  Not fasting at present.   Basal cell carcinoma removed form thorax by Dr. Karlyn Agee in January of this year. Had other areas on chest treated with a cream.  Prior indoor tanning.  None recently.   On Acetretin for psoriasis.   Tobacco abuse - quit with spouse 5 and 1/2 weeks ago. Has been trying to walk more as eating some more.     Review of Systems  Constitutional: Negative for fatigue and unexpected weight change.  Respiratory: Negative for chest tightness and shortness of breath.   Cardiovascular: Negative for chest pain, palpitations and leg swelling.  Gastrointestinal: Negative for abdominal pain and blood in stool.  Neurological: Negative for dizziness, syncope, light-headedness and headaches.       Objective:   Physical Exam  Vitals reviewed. Constitutional: She is oriented to person, place, and time. She appears well-developed and well-nourished.  HENT:  Head: Normocephalic and atraumatic.  Eyes: Conjunctivae and EOM are normal. Pupils are equal, round, and reactive to light.  Neck: Carotid bruit is not present.  Cardiovascular: Normal rate, regular rhythm, normal heart sounds and intact distal pulses.   Pulmonary/Chest: Effort normal and breath sounds normal.  Abdominal: Soft. She exhibits no pulsatile midline mass. There is no tenderness.  Neurological: She is alert and oriented to person, place, and time.  Skin: Skin is  warm and dry.  Psychiatric: She has a normal mood and affect. Her behavior is normal.          Assessment & Plan:  Kellie Mason is a 52 y.o. female Need for prophylactic vaccination with combined diphtheria-tetanus-pertussis (DTP) vaccine - Plan: Tdap vaccine greater than or equal to 7yo IM  History of basal cell carcinoma  HTN (hypertension) - Plan: Basic metabolic panel, hydrochlorothiazide (MICROZIDE) 12.5 MG capsule, amLODipine (NORVASC) 2.5 MG tablet   HTN - stable today. Can try to hold norvasc for 2 weeks, then stop if BP remains below 140/90. Restart if blood pressure goes back above 140/90. BMP checked, will try to obtain labs performed by derm recently.   Tob abuse - by hx - congratulated on cessation.   Plan on recheck in 6 months.    Tdap updated today.   Meds ordered this encounter           . hydrochlorothiazide (MICROZIDE) 12.5 MG capsule    Sig: Take 1 capsule (12.5 mg total) by mouth every morning. Needs office visit    Dispense:  90 capsule    Refill:  1            . amLODipine (NORVASC) 2.5 MG tablet    Sig: Take 1 tablet (2.5 mg total) by mouth daily. Needs office visit    Dispense:  30 tablet    Refill:  5   Patient Instructions  Your should receive a call or letter about your lab  results within the next week to 10 days.  Continue hydrochlorothiazide, but can hold on restart of amlodipine as long as your outside blood pressures do not remain above 140/90 (buy new blood pressure cuff that goes around upper arm or use one at the local pharmacy or [facility] or [facility])   Restart this medicine if they do go back up.  Return to the clinic or go to the nearest emergency room if any of your symptoms worsen or new symptoms occur. Plan on physical in next 6 months.  Keep up the good work with quitting smoking.  Let me know if I can help in this regard.

## 2013-01-21 ENCOUNTER — Encounter: Payer: Self-pay | Admitting: Family Medicine

## 2013-01-21 ENCOUNTER — Ambulatory Visit: Payer: PRIVATE HEALTH INSURANCE

## 2013-01-21 ENCOUNTER — Ambulatory Visit (INDEPENDENT_AMBULATORY_CARE_PROVIDER_SITE_OTHER): Payer: PRIVATE HEALTH INSURANCE | Admitting: Family Medicine

## 2013-01-21 VITALS — BP 118/72 | HR 70 | Temp 98.0°F | Resp 16

## 2013-01-21 DIAGNOSIS — S93409A Sprain of unspecified ligament of unspecified ankle, initial encounter: Secondary | ICD-10-CM

## 2013-01-21 DIAGNOSIS — M79609 Pain in unspecified limb: Secondary | ICD-10-CM

## 2013-01-21 DIAGNOSIS — M79672 Pain in left foot: Secondary | ICD-10-CM

## 2013-01-21 DIAGNOSIS — S43422A Sprain of left rotator cuff capsule, initial encounter: Secondary | ICD-10-CM

## 2013-01-21 MED ORDER — HYDROCODONE-ACETAMINOPHEN 5-325 MG PO TABS: 2.0000 | ORAL_TABLET | Freq: Four times a day (QID) | ORAL | Status: AC | PRN

## 2013-01-21 NOTE — Patient Instructions (Addendum)
Wear Cam Walker  Aleve 2 pills twice daily  Elevate foot and apply ice for 15 min 4-5 times daily  Return if worse or not improving in a week or 10 days.

## 2013-01-21 NOTE — Progress Notes (Signed)
Subjective: 52 year old lady who was at her husbands business place and stepped off a board about 6-8 inches high and turned her left ankle. She apparently walked on it for a time after that, but later started having a lot of pain. It hurt to walk. She works in [employer] and goes up and down a ladder a lot. She did not hear any pop when she turned it. She was where his fingers. She did not fall down but it. She had a tiny abrasion over lower shin.  Objective: Patient has laying down, off of her foot. She has some swelling anteriorly in the left ankle. Motion of toes is adequate. Pulse good. She is tender over the left lateral wall LS and below the left medial malleolus. Nontender on Achilles or heel itself.  Assessment: Left ankle injury and pain  Plan: X-ray left ankle  [facility] reading (PRIMARY) by  Dr. Alwyn Ren No bony abnormality.  Mild soft tissue swelling  DX:  Sprain ankle.

## 2013-02-03 ENCOUNTER — Telehealth: Payer: Self-pay

## 2013-02-03 NOTE — Telephone Encounter (Signed)
I can not send information to the employer, will you advise on estimated recovery time, and I can provide note for patient. If she wants her employer to have this, she can provide it. Please advise.

## 2013-02-03 NOTE — Telephone Encounter (Signed)
Pt states her employer is insisting that he have restrictions,time of recovery etc for this pt's ankle sprain(not wc) She is requesting that we fax this info to her employer at [phone]?   Best phone for pt is [phone]

## 2013-02-04 NOTE — Telephone Encounter (Signed)
Sprains tend to improve significantly over the first 7-10 days, but she may not be fully healed for 4-6 weeks.  This would not necessarily preclude her from working.  Dr. Alwyn Ren did not indicate restrictions in his note, so I assume he did not feel that her activity needed to be restricted.

## 2013-02-04 NOTE — Telephone Encounter (Signed)
Called patient. Left message for her to call me back. Note printed.

## 2013-02-17 ENCOUNTER — Telehealth: Payer: Self-pay

## 2013-02-17 NOTE — Telephone Encounter (Signed)
Pt requesting another note for her work stating she will be recovering from her ankle [place] for an indefinite time. We had said four weeks and she walks all day on concrete at work and says she will not have a full recovery In the allotted four week time period??? She would like this note to be faxed to [phone]   Best phone for pt is her husband mike's at [phone] (cant receive phone calls at work)

## 2013-02-17 NOTE — Telephone Encounter (Signed)
Called her husband. He is advised

## 2013-02-17 NOTE — Telephone Encounter (Signed)
If she is still painful, she should return to clinic

## 2013-02-21 ENCOUNTER — Ambulatory Visit (INDEPENDENT_AMBULATORY_CARE_PROVIDER_SITE_OTHER): Payer: PRIVATE HEALTH INSURANCE | Admitting: Family Medicine

## 2013-02-21 VITALS — BP 116/72 | HR 86 | Temp 97.9°F | Resp 18 | Ht 64.5 in | Wt 114.0 lb

## 2013-02-21 DIAGNOSIS — M25579 Pain in unspecified ankle and joints of unspecified foot: Secondary | ICD-10-CM

## 2013-02-21 DIAGNOSIS — M25572 Pain in left ankle and joints of left foot: Secondary | ICD-10-CM

## 2013-02-21 DIAGNOSIS — Z5189 Encounter for other specified aftercare: Secondary | ICD-10-CM

## 2013-02-21 DIAGNOSIS — S93409D Sprain of unspecified ligament of unspecified ankle, subsequent encounter: Secondary | ICD-10-CM

## 2013-02-21 NOTE — Progress Notes (Signed)
  Subjective:    Patient ID: Kellie Mason, female    DOB: [birth year], 52 y.o.   MRN: [id number]  HPI Kellie Mason is a 52 y.o. female  Follow up L ankle injury. At lunch on July 1st - walking on deck, slipped, turned ankle, and abrasion in front of ankle. intially able to WB,  Then after a few hours - more sore, couldn't put weight on it.  Saw Dr Alwyn Ren that day. No apparent fracture, placed in walking boot, declined pain meds, but can take Alleve without difficulty- took this initially.  Wearing boot initially all the time for 1st 2 and a half to 3 weeks, now just wearing at work or long distances, also wearing off the shelf wrap inside boot. Sore at night, sometimes feels better to stand.   Feels better, but still unsafe if nothing on ankle. Much less pain.   Has to climb ladders at work - unable to do this with walking boot.    Review of Systems  Constitutional: Negative for fever and chills.  Musculoskeletal: Positive for myalgias, joint swelling and arthralgias.       Objective:   Physical Exam  Vitals reviewed. Constitutional: She is oriented to person, place, and time. She appears well-developed and well-nourished. No distress.  Pulmonary/Chest: Effort normal.  Musculoskeletal:       Left foot: She exhibits decreased range of motion (slight decr rom globally at L ankle vs R side. ), tenderness and swelling (min effusion laterally - sligh ttp over atf. ). She exhibits no bony tenderness (no navicular, 5th mt, or malleolar ttp. negative vibratory fork testing. ).       Feet:  Neurological: She is alert and oriented to person, place, and time.  Skin: Skin is warm, dry and intact. No ecchymosis and no rash noted. No erythema.  Psychiatric: She has a normal mood and affect. Her behavior is normal.          Assessment & Plan:  Kellie Mason is a 52 y.o. female Sprain of ligament of ankle, unspecified laterality, subsequent encounter   L lateral ankle sprain - some  component of deconditioning at this point - will try HEP - handout and theraband given , change to [facility] brace as needed - continue to avoid ladders until strength improved. alleve if needed. rtc precautions.   Patient Instructions  Range of motion and balance exercises as discussed. Avoid ladders for next 10 days, sweedo brace as needed, but come out few times per day for range of motion.  Consider physical therapy if not improving. Return to the clinic or go to the nearest emergency room if any of your symptoms worsen or new symptoms occur.

## 2013-02-21 NOTE — Patient Instructions (Signed)
Range of motion and balance exercises as discussed. Avoid ladders for next 10 days, sweedo brace as needed, but come out few times per day for range of motion.  Consider physical therapy if not improving. Return to the clinic or go to the nearest emergency room if any of your symptoms worsen or new symptoms occur.

## 2013-03-04 ENCOUNTER — Encounter: Payer: Self-pay | Admitting: Radiology

## 2013-03-04 ENCOUNTER — Telehealth: Payer: Self-pay

## 2013-03-04 DIAGNOSIS — M25572 Pain in left ankle and joints of left foot: Secondary | ICD-10-CM

## 2013-03-04 NOTE — Telephone Encounter (Signed)
I am ok with her continuing HEP for another week to 10 days if she is improving ,or if she feels like it is not improving - can refer to PT now (or in 10 days) - what would she like to do?

## 2013-03-04 NOTE — Telephone Encounter (Signed)
Note faxed for her. Please advise on message below

## 2013-03-04 NOTE — Telephone Encounter (Signed)
PT STATES SHE HAD SPRUNG HER ANKLE AND GIVEN A NOTE FOR MODIFIED DUTY, NOW HER EMPLOYER WANTS HER TO GET ANOTHER NOTE FOR 10 MORE DAYS FOR LIGHT DUTY AND SHE CAN'T CLIMB LATTER'S EITHER. PLEASE CALL [phone] AND THE FAX IS [phone]

## 2013-03-04 NOTE — Telephone Encounter (Signed)
I can extend the note for her, please advise, your note indicated if not better consider PT, referral pended, do you want her to go?

## 2013-03-05 NOTE — Telephone Encounter (Signed)
Thanks. I have called her left message for her to call me back.

## 2013-03-07 NOTE — Telephone Encounter (Signed)
Left message for her again.

## 2013-03-17 ENCOUNTER — Encounter: Payer: Self-pay | Admitting: Family Medicine

## 2013-03-17 ENCOUNTER — Ambulatory Visit (INDEPENDENT_AMBULATORY_CARE_PROVIDER_SITE_OTHER): Payer: PRIVATE HEALTH INSURANCE | Admitting: Family Medicine

## 2013-03-17 ENCOUNTER — Ambulatory Visit: Payer: PRIVATE HEALTH INSURANCE

## 2013-03-17 VITALS — BP 116/78 | HR 80 | Temp 99.4°F | Resp 16 | Ht 65.0 in | Wt 116.2 lb

## 2013-03-17 DIAGNOSIS — M25579 Pain in unspecified ankle and joints of unspecified foot: Secondary | ICD-10-CM

## 2013-03-17 DIAGNOSIS — M25572 Pain in left ankle and joints of left foot: Secondary | ICD-10-CM

## 2013-03-17 NOTE — Patient Instructions (Addendum)
There could be an injury to the navicular bone on the inside of your foot - return to wearing the walking boot until seen by ortho. If pain in walking boot, may need to use crutches to not bear weight on this ankle. Ok to walk in boot if not painful, but no ladders/climbing for now.  If seen by ortho and they do not see a fracture, then let them know your preferences for physical therapy.  Tylenol or alleve if needed, call me if stronger medicine needed.

## 2013-03-17 NOTE — Progress Notes (Signed)
  Subjective:    Patient ID: Kellie Mason, female    DOB: [birth year], 52 y.o.   MRN: [id number]  HPI LATAJAH THUMAN is a 52 y.o. female  L ankle pain - prior sprain - had been improving ( 90-95% better, but not on ladder yet) then 2 weeks ago - tripped over dog - reinjured L ankle. Sharp pains with weight bearing, but walking on it. Has not had evaluated since reinjury. Not as swollen, but still sore in ankle. Pain not improved from 2 weeks ago. Has not seen ortho or PT. alleve 2 per week, ice initially.  Able to walk in straight line without much discomfort, but any turning hurts.   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    Review of Systems  Constitutional: Negative for fever and chills.  Musculoskeletal: Positive for joint swelling (initially, now improved. ) and arthralgias (L ankle. ).  Skin: Negative for rash and wound.       Objective:   Physical Exam  Vitals reviewed. Constitutional: She is oriented to person, place, and time. She appears well-developed and well-nourished. No distress.  HENT:  Head: Normocephalic and atraumatic.  Pulmonary/Chest: Effort normal.  Musculoskeletal:       Left ankle: She exhibits decreased range of motion (global decr L ankle ROM. ). Tenderness. Lateral malleolus tenderness found. No medial malleolus, no AITFL, no CF ligament, no head of 5th metatarsal and no proximal fibula tenderness found. Achilles tendon exhibits no pain, no defect and normal Thompson's test results.       Left foot: She exhibits tenderness and bony tenderness (navicula. ). She exhibits no swelling (no focal swelling, no echymosis - skin intact. ).       Feet:  Neurological: She is alert and oriented to person, place, and time.  nvi distally.   Skin: Skin is warm and dry. No rash noted.  Psychiatric: She has a normal mood and affect. Her behavior is normal.    [facility] reading (PRIMARY) by  Dr. Neva Seat: L ankle, L foot:  Ankle: no apprent fx L foot: ? Irregular  medial aspect vs ND navicular fx.        Assessment & Plan:  Kellie Mason is a 52 y.o. female Pain in joint, ankle and foot, left - Plan: DG Foot Complete Left, DG Ankle Complete Left, Ambulatory referral to Orthopedic Surgery  Prior ankle sprain with reinjury - single concerning area on xray for possible ND navicular fx. Will rave restart Cam walker - has at home, then if still discomfort - crutches/NWB.  Discussed work activity with this plan. Will also refer to ortho for eval. rtc sooner if worse.   Patient Instructions  There could be an injury to the navicular bone on the inside of your foot - return to wearing the walking boot until seen by ortho. If pain in walking boot, may need to use crutches to not bear weight on this ankle. Ok to walk in boot if not painful, but no ladders/climbing for now.  If seen by ortho and they do not see a fracture, then let them know your preferences for physical therapy.  Tylenol or alleve if needed, call me if stronger medicine needed.

## 2013-04-02 ENCOUNTER — Encounter: Payer: Self-pay | Admitting: Family Medicine

## 2013-05-26 ENCOUNTER — Ambulatory Visit (INDEPENDENT_AMBULATORY_CARE_PROVIDER_SITE_OTHER): Payer: PRIVATE HEALTH INSURANCE | Admitting: Family Medicine

## 2013-05-26 VITALS — BP 136/80 | HR 84 | Temp 99.0°F | Resp 16 | Ht 65.0 in | Wt 118.0 lb

## 2013-05-26 DIAGNOSIS — I10 Essential (primary) hypertension: Secondary | ICD-10-CM

## 2013-05-26 MED ORDER — AMLODIPINE BESYLATE 2.5 MG PO TABS: 2.5000 mg | ORAL_TABLET | Freq: Every day | ORAL | Status: DC

## 2013-05-26 MED ORDER — HYDROCHLOROTHIAZIDE 12.5 MG PO CAPS: 12.5000 mg | ORAL_CAPSULE | ORAL | Status: DC

## 2013-05-26 NOTE — Patient Instructions (Addendum)
Keep a record of your blood pressures outside of the office and bring them to the next office visit. If they are running low in the morning, you could try a few weeks off the amlodipine, but try to eat a snack in the morning to see of this is why you are dizzy.  Return to the clinic or go to the nearest emergency room if any of your symptoms worsen or new symptoms occur. Have your dermatologist send a copy of labs here (I am looking at electrolytes and kidney test).

## 2013-05-26 NOTE — Progress Notes (Signed)
Subjective:    Patient ID: Kellie Mason, female    DOB: 08-10-1960, 52 y.o.   MRN: [id number]  HPI Kellie Mason is a 52 y.o. female  HTN - last eval for this in April. Plan to try to hold try to hold norvasc for 2 weeks, but has stayed on it.  Not checking outside blood pressures.  No chest pain, cough, dyspnea or leg swelling, no new cramps. Occasional lightheaded in the morning at work but not eating until 10. Has bloodwork scheduled at dermatologist in few weeks. Will have sent here.   Has been followed by Dr. Darrelyn Hillock for L ankle pain, released back to work about 2 weeks.ago, but wearing brace Given instructions to call Dr. Darrelyn Hillock if needed, but has not done this yet.  Results for orders placed in visit on 11/18/12  BASIC METABOLIC PANEL      Result Value Range   Sodium 136  135 - 145 mEq/L   Potassium 4.1  3.5 - 5.3 mEq/L   Chloride 104  96 - 112 mEq/L   CO2 25  19 - 32 mEq/L   Glucose, Bld 82  70 - 99 mg/dL   BUN 14  6 - 23 mg/dL   Creat 1.47  8.29 - 5.62 mg/dL   Calcium 9.6  8.4 - 13.0 mg/dL    Review of Systems  Constitutional: Negative for fatigue and unexpected weight change.  Respiratory: Negative for chest tightness and shortness of breath.   Cardiovascular: Negative for chest pain, palpitations and leg swelling.  Gastrointestinal: Negative for abdominal pain and blood in stool.  Neurological: Negative for dizziness (in am only as above. ), syncope, light-headedness and headaches.       Objective:   Physical Exam  Vitals reviewed. Constitutional: She is oriented to person, place, and time. She appears well-developed and well-nourished.  HENT:  Head: Normocephalic and atraumatic.  Eyes: Conjunctivae and EOM are normal. Pupils are equal, round, and reactive to light.  Neck: Carotid bruit is not present.  Cardiovascular: Normal rate, regular rhythm, normal heart sounds and intact distal pulses.   Pulmonary/Chest: Effort normal and breath sounds normal.   Abdominal: Soft. She exhibits no pulsatile midline mass. There is no tenderness.  Neurological: She is alert and oriented to person, place, and time.  Skin: Skin is warm and dry.  Psychiatric: She has a normal mood and affect. Her behavior is normal.   Wearing brace on L ankle.     Assessment & Plan:   [name] is a 52 y.o. female HTN (hypertension) - Plan: amLODipine (NORVASC) 2.5 MG tablet, hydrochlorothiazide (MICROZIDE) 12.5 MG capsule.   -stable, cont same doses, but if low BP's at home, can try holding norvasc for few weeks. Suspect BP not cause of am dizziness, but if not improved with incr fluids and am snack of some type (granola bar, etc discussed), can look at medicines as contributor if needed. rtc precautions. Will have labs from derm appt sent here to eval lytes and kidney function.    Meds ordered this encounter  Medications  . amLODipine (NORVASC) 2.5 MG tablet    Sig: Take 1 tablet (2.5 mg total) by mouth daily.    Dispense:  90 tablet    Refill:  1    Order Specific Question:  Supervising Provider    Answer:  DOOLITTLE, ROBERT P [[id number]]  . hydrochlorothiazide (MICROZIDE) 12.5 MG capsule    Sig: Take 1 capsule (12.5 mg total) by mouth every  morning.    Dispense:  90 capsule    Refill:  1    Order Specific Question:  Supervising Provider    Answer:  Tonye Pearson [[id number]]   Patient Instructions  Keep a record of your blood pressures outside of the office and bring them to the next office visit. If they are running low in the morning, you could try a few weeks off the amlodipine, but try to eat a snack in the morning to see of this is why you are dizzy.  Return to the clinic or go to the nearest emergency room if any of your symptoms worsen or new symptoms occur. Have your dermatologist send a copy of labs here (I am looking at electrolytes and kidney test).

## 2013-06-14 ENCOUNTER — Encounter: Payer: Self-pay | Admitting: Family Medicine

## 2013-06-14 NOTE — Progress Notes (Unsigned)
On January 21, 2013 patient was seen for an ankle injury.  Somehow the wrong diagnosis was entered for rotator cuff rather than for the ankle pain/sprain. The only way I know to correct this is with an erroneous encounter entry.   Diagnosis:

## 2013-09-02 ENCOUNTER — Other Ambulatory Visit: Payer: Self-pay | Admitting: Obstetrics and Gynecology

## 2013-09-02 DIAGNOSIS — R928 Other abnormal and inconclusive findings on diagnostic imaging of breast: Secondary | ICD-10-CM

## 2013-09-15 ENCOUNTER — Ambulatory Visit
Admission: RE | Admit: 2013-09-15 | Discharge: 2013-09-15 | Disposition: A | Discharge status: Home / Self Care | Payer: No Typology Code available for payment source | Source: Ambulatory Visit | Attending: Obstetrics and Gynecology | Admitting: Obstetrics and Gynecology

## 2013-09-15 DIAGNOSIS — R928 Other abnormal and inconclusive findings on diagnostic imaging of breast: Secondary | ICD-10-CM

## 2013-12-22 ENCOUNTER — Other Ambulatory Visit: Payer: Self-pay | Admitting: Family Medicine

## 2013-12-29 ENCOUNTER — Encounter: Payer: Self-pay | Admitting: Family Medicine

## 2013-12-29 ENCOUNTER — Ambulatory Visit (INDEPENDENT_AMBULATORY_CARE_PROVIDER_SITE_OTHER): Payer: PRIVATE HEALTH INSURANCE | Admitting: Family Medicine

## 2013-12-29 VITALS — BP 140/90 | HR 84 | Temp 99.5°F | Resp 16 | Ht 64.75 in | Wt 117.2 lb

## 2013-12-29 DIAGNOSIS — I10 Essential (primary) hypertension: Secondary | ICD-10-CM

## 2013-12-29 LAB — LIPID PANEL
CHOL/HDL RATIO: 2.4 ratio
Cholesterol: 229 mg/dL — ABNORMAL HIGH (ref 0–200)
HDL: 94 mg/dL (ref >39)
LDL Cholesterol: 117 mg/dL — ABNORMAL HIGH (ref 0–99)
Triglycerides: 88 mg/dL (ref <150)
VLDL: 18 mg/dL (ref 0–40)

## 2013-12-29 LAB — COMPLETE METABOLIC PANEL WITH GFR
ALBUMIN: 4.7 g/dL (ref 3.5–5.2)
ALK PHOS: 64 U/L (ref 39–117)
ALT: 16 U/L (ref 0–35)
AST: 16 U/L (ref 0–37)
BILIRUBIN TOTAL: 0.9 mg/dL (ref 0.2–1.2)
BUN: 13 mg/dL (ref 6–23)
CO2: 28 mEq/L (ref 19–32)
Calcium: 10.6 mg/dL — ABNORMAL HIGH (ref 8.4–10.5)
Chloride: 99 mEq/L (ref 96–112)
Creat: 0.58 mg/dL (ref 0.50–1.10)
GFR, Est African American: >89 mL/min
GFR, Est Non African American: >89 mL/min
GLUCOSE: 90 mg/dL (ref 70–99)
POTASSIUM: 4.2 meq/L (ref 3.5–5.3)
SODIUM: 136 meq/L (ref 135–145)
TOTAL PROTEIN: 6.9 g/dL (ref 6.0–8.3)

## 2013-12-29 MED ORDER — AMLODIPINE BESYLATE 2.5 MG PO TABS: 2.5000 mg | ORAL_TABLET | Freq: Every day | ORAL | Status: DC

## 2013-12-29 MED ORDER — HYDROCHLOROTHIAZIDE 12.5 MG PO CAPS: 12.5000 mg | ORAL_CAPSULE | ORAL | Status: DC

## 2013-12-29 NOTE — Progress Notes (Signed)
Subjective:    Patient ID: Kellie Mason, female    DOB: 09/15/1960, 53 y.o.   MRN: [id number]  HPI Kellie Mason is a 54 y.o. female  Here for follow up of HTN.  Has been running low of meds, had to skip a few doses here and there to get through. When taking meds, has not taken BP readings at home. Last dose hctz this am. Missed doses yesterday. No new side effects of meds.     Chemistry      Component Value Date/Time   NA 136 11/18/2012 1547   K 4.1 11/18/2012 1547   CL 104 11/18/2012 1547   CO2 25 11/18/2012 1547   BUN 14 11/18/2012 1547   CREATININE 0.75 11/18/2012 1547      Component Value Date/Time   CALCIUM 9.6 11/18/2012 1547      Still on FMLA for foot injury, followed by ortho.     Patient Active Problem List   Diagnosis Date Noted  . History of basal cell carcinoma 11/18/2012  . Atopic dermatitis 04/12/2012  . Hypertension 02/06/2011  . Tobacco use disorder 02/06/2011  . Healthcare maintenance 02/06/2011   Past Medical History  Diagnosis Date  . Hypertension   . Dizziness - light-headed   . Chest pain   . Palpitations   . Tobacco abuse     No ready to quit  . Cough   . Basal cell carcinoma of anterior chest     shave bx - 08/12/12 - Dr. Wilhemina Bonito.  . Arthritis    Past Surgical History  Procedure Laterality Date  . Electrocardiogram    . Knee arthroscopy Left    Allergies  Allergen Reactions  . Cannabinoids   . Tetracyclines & Related   . Codeine Rash   Prior to Admission medications   Medication Sig Start Date End Date Taking? Authorizing Provider  amLODipine (NORVASC) 2.5 MG tablet Take 1 tablet (2.5 mg total) by mouth daily. PATIENT NEEDS OFFICE VISIT FOR ADDITIONAL REFILLS 12/22/13  Yes Eleanore Kurtis Bushman, PA-C  Calcium Carbonate-Vit D-Min (CALTRATE PLUS PO) Take by mouth.     Yes Historical Provider, MD  Fish Oil OIL     Yes Historical Provider, MD  GLUCOSAMINE PO Take 1,200 mg by mouth daily.   Yes Historical Provider, MD  hydrochlorothiazide  (MICROZIDE) 12.5 MG capsule Take 1 capsule (12.5 mg total) by mouth every morning. 05/26/13  Yes Wendie Agreste, MD  OVER THE COUNTER .MEDICATION OTC Niacin taking   Yes Historical Provider, MD  B Complex-C (SUPER B COMPLEX PO) Take by mouth. daily     Historical Provider, MD  BIOTIN PO Take by mouth daily.    Historical Provider, MD  ESTROGENS CONJ SYNTHETIC A PO Take by mouth.    Historical Provider, MD  PRESCRIPTION MEDICATION Acetretin tablet taking on Monday, Wednesday, Friday per Dr. Lindwood Coke    Historical Provider, MD   History   Social History  . Marital Status: Married    Spouse Name: N/A    Number of Children: N/A  . Years of Education: N/A   Occupational History  . Works for Tree surgeon   Social History Main Topics  . Smoking status: Former Smoker -- 0.50 packs/day  . Smokeless tobacco: Not on file  . Alcohol Use: 1.0 oz/week    2 drink(s) per week  . Drug Use: No  . Sexual Activity: Yes   Other Topics Concern  .  Not on file   Social History Narrative   Married. Exercises 5 days per week...speed walking, 3-4 miles       Review of Systems  Constitutional: Negative for fatigue and unexpected weight change.  Respiratory: Negative for chest tightness and shortness of breath.   Cardiovascular: Negative for chest pain, palpitations and leg swelling.  Gastrointestinal: Negative for abdominal pain and blood in stool.  Neurological: Negative for dizziness, syncope, light-headedness and headaches.       Objective:   Physical Exam  Vitals reviewed. Constitutional: She is oriented to person, place, and time. She appears well-developed and well-nourished.  HENT:  Head: Normocephalic and atraumatic.  Eyes: Conjunctivae and EOM are normal. Pupils are equal, round, and reactive to light.  Neck: Carotid bruit is not present.  Cardiovascular: Normal rate, regular rhythm, normal heart sounds and intact distal pulses.   Pulmonary/Chest:  Effort normal and breath sounds normal.  Abdominal: Soft. She exhibits no pulsatile midline mass. There is no tenderness.  Neurological: She is alert and oriented to person, place, and time.  Skin: Skin is warm and dry.  Psychiatric: She has a normal mood and affect. Her behavior is normal.   Filed Vitals:   12/29/13 1445  BP: 140/90  Pulse: 84  Temp: 99.5 F (37.5 C)  TempSrc: Oral  Resp: 16  Height: 5' 4.75" (1.645 m)  Weight: 117 lb 3.2 oz (53.162 kg)  SpO2: 100%      Assessment & Plan:   Kellie Mason is a 53 y.o. female HTN (hypertension) - Plan: COMPLETE METABOLIC PANEL WITH GFR, Lipid panel, hydrochlorothiazide (MICROZIDE) 12.5 MG capsule, amLODipine (NORVASC) 2.5 MG tablet Stable overall, and borderline with recent intermittent missed dose recently. Check home BP's and if remains elevated - call to discuss changes. Labs pending.  Meds ordered this encounter  Medications  . OVER THE COUNTER MEDICATION    Sig: OTC Niacin taking  . hydrochlorothiazide (MICROZIDE) 12.5 MG capsule    Sig: Take 1 capsule (12.5 mg total) by mouth every morning.    Dispense:  90 capsule    Refill:  1    Order Specific Question:  Supervising Provider    Answer:  DOOLITTLE, ROBERT P [[id number]]  . amLODipine (NORVASC) 2.5 MG tablet    Sig: Take 1 tablet (2.5 mg total) by mouth daily.    Dispense:  90 tablet    Refill:  1   Patient Instructions  You should receive a call or letter about your lab results within the next week to 10 days.  Keep a record of your blood pressures outside of the office and bring them to the next office visit. If they are running over 140/90 persistently - let me know as you may need to be on different doses of your medicines.   plan on physical in 6 months.   Return to the clinic or go to the nearest emergency room if any of your symptoms worsen or new symptoms occur.

## 2013-12-29 NOTE — Patient Instructions (Addendum)
You should receive a call or letter about your lab results within the next week to 10 days.  Keep a record of your blood pressures outside of the office and bring them to the next office visit. If they are running over 140/90 persistently - let me know as you may need to be on different doses of your medicines.   plan on physical in 6 months.   Return to the clinic or go to the nearest emergency room if any of your symptoms worsen or new symptoms occur.

## 2014-02-19 ENCOUNTER — Other Ambulatory Visit: Payer: Self-pay | Admitting: Obstetrics and Gynecology

## 2014-02-19 DIAGNOSIS — R921 Mammographic calcification found on diagnostic imaging of breast: Secondary | ICD-10-CM

## 2014-02-25 ENCOUNTER — Other Ambulatory Visit: Payer: Self-pay | Admitting: Obstetrics and Gynecology

## 2014-02-26 LAB — CYTOLOGY - PAP

## 2014-03-17 ENCOUNTER — Ambulatory Visit
Admission: RE | Admit: 2014-03-17 | Discharge: 2014-03-17 | Disposition: A | Discharge status: Home / Self Care | Payer: No Typology Code available for payment source | Source: Ambulatory Visit | Attending: Obstetrics and Gynecology | Admitting: Obstetrics and Gynecology

## 2014-03-17 DIAGNOSIS — R921 Mammographic calcification found on diagnostic imaging of breast: Secondary | ICD-10-CM

## 2014-07-21 ENCOUNTER — Telehealth: Payer: Self-pay

## 2014-07-21 DIAGNOSIS — I10 Essential (primary) hypertension: Secondary | ICD-10-CM

## 2014-07-21 MED ORDER — HYDROCHLOROTHIAZIDE 12.5 MG PO CAPS: 12.5000 mg | ORAL_CAPSULE | ORAL | Status: DC

## 2014-07-21 MED ORDER — AMLODIPINE BESYLATE 2.5 MG PO TABS
2.5000 mg | ORAL_TABLET | Freq: Every day | ORAL | Status: DC
Start: 2014-07-21 — End: 2014-08-31

## 2014-07-21 NOTE — Telephone Encounter (Signed)
Refill sent. Pt notified.

## 2014-07-21 NOTE — Telephone Encounter (Signed)
Patient has one pill left and needs a refill on the medication below.  Patient scheduled an appt on 08/31/2014 @ 4:15 with Dr. Carlota Raspberry   amLODipine [facility]) hydrochlorothiazide [facility])   [place] [address] # [phone]

## 2014-08-22 ENCOUNTER — Encounter: Payer: Self-pay | Admitting: Family Medicine

## 2014-08-22 ENCOUNTER — Ambulatory Visit (INDEPENDENT_AMBULATORY_CARE_PROVIDER_SITE_OTHER): Payer: PRIVATE HEALTH INSURANCE

## 2014-08-22 ENCOUNTER — Ambulatory Visit (INDEPENDENT_AMBULATORY_CARE_PROVIDER_SITE_OTHER): Payer: PRIVATE HEALTH INSURANCE | Admitting: Family Medicine

## 2014-08-22 VITALS — BP 132/70 | HR 80 | Resp 16

## 2014-08-22 DIAGNOSIS — W19XXXA Unspecified fall, initial encounter: Secondary | ICD-10-CM

## 2014-08-22 DIAGNOSIS — R0781 Pleurodynia: Secondary | ICD-10-CM

## 2014-08-22 DIAGNOSIS — S2232XA Fracture of one rib, left side, initial encounter for closed fracture: Secondary | ICD-10-CM

## 2014-08-22 MED ORDER — HYDROCODONE-ACETAMINOPHEN 5-325 MG PO TABS: 1.0000 | ORAL_TABLET | Freq: Four times a day (QID) | ORAL | Status: DC | PRN

## 2014-08-22 NOTE — Patient Instructions (Signed)

## 2014-08-22 NOTE — Progress Notes (Signed)
This chart was scribed for Dr. Robyn Haber, MD by Erling Conte, Medical Scribe. This patient was seen in Room 6 and the patient's care was started at 11:39 AM.   Patient ID: Kellie Mason MRN: [id number], DOB: Dec 03, 1960, 54 y.o. Date of Encounter: 08/22/2014, 11:36 AM  Primary Physician: Wendie Agreste, MD  Chief Complaint:  Chief Complaint  Patient presents with   Rib Injury    fell and hit table-injured right ribs     HPI: 54 y.o. year old female with history below presents with left rib injury. Pt states that last night around midnight she slipped on a rub and fell and caught the edge of the coffee table. She denies any bruising. The pain is exacerbated by deep inspiration and movement. Pt is a current everyday smoker. She denies any shortness of breathe.    Pt works as a Radio broadcast assistant   Past Medical History  Diagnosis Date   Hypertension    Dizziness - light-headed    Chest pain    Palpitations    Tobacco abuse     No ready to quit   Cough    Basal cell carcinoma of anterior chest     shave bx - 08/12/12 - Dr. Wilhemina Bonito.   Arthritis      Home Meds: Prior to Admission medications   Medication Sig Start Date End Date Taking? Authorizing Provider  amLODipine (NORVASC) 2.5 MG tablet Take 1 tablet (2.5 mg total) by mouth daily. 07/21/14   Mancel Bale, PA-C  Calcium Carbonate-Vit D-Min (CALTRATE PLUS PO) Take by mouth.      Historical Provider, MD  Fish Oil OIL      Historical Provider, MD  GLUCOSAMINE PO Take 1,200 mg by mouth daily.    Historical Provider, MD  hydrochlorothiazide (MICROZIDE) 12.5 MG capsule Take 1 capsule (12.5 mg total) by mouth every morning. 07/21/14   Mancel Bale, PA-C  OVER THE COUNTER MEDICATION OTC Niacin taking    Historical Provider, MD    Allergies:  Allergies  Allergen Reactions   Cannabinoids    Tetracyclines & Related    Codeine Rash    History   Social History   Marital Status: Married    Spouse Name:  N/A    Number of Children: N/A   Years of Education: N/A   Occupational History   Works for Tree surgeon   Social History Main Topics   Smoking status: Former Smoker -- 0.50 packs/day   Smokeless tobacco: Not on file   Alcohol Use: 1.0 oz/week    2 drink(s) per week   Drug Use: No   Sexual Activity: Yes   Other Topics Concern   Not on file   Social History Narrative   Married. Exercises 5 days per week...speed walking, 3-4 miles     Review of Systems: Constitutional: negative for chills, fever, night sweats, weight changes, or fatigue  HEENT: negative for vision changes, hearing loss, congestion, rhinorrhea, ST, epistaxis, or sinus pressure Cardiovascular: negative for chest pain or palpitations Respiratory: negative for hemoptysis, wheezing, shortness of breath, or cough Abdominal: negative for abdominal pain, nausea, vomiting, diarrhea, or constipation Dermatological: negative for rash or ecchymosis  Neurologic: negative for headache, dizziness, or syncope [place]: positive for left rib pain All other systems reviewed and are otherwise negative with the exception to those above and in the HPI.   Physical Exam: Blood pressure 182/90, pulse 126, resp. rate 36., There is no  weight on file to calculate BMI. General: Well developed, well nourished, in no acute distress. Head: Normocephalic, atraumatic, eyes without discharge, sclera non-icteric, nares are without discharge. Bilateral auditory canals clear, TM's are without perforation, pearly grey and translucent with reflective cone of light bilaterally. Oral cavity moist, posterior pharynx without exudate, erythema, peritonsillar abscess, or post nasal drip.  Neck: Supple. No thyromegaly. Full ROM. No lymphadenopathy. Lungs: Clear bilaterally to auscultation without wheezes, rales, or rhonchi. Breathing is unlabored. Heart: RRR with S1 S2. No murmurs, rubs, or gallops appreciated. Abdomen: Soft,  non-tender, non-distended with normoactive bowel sounds. No hepatomegaly. No rebound/guarding. No obvious abdominal masses. Msk:  Strength and tone normal for age. Tender along lateral left ribs T7-T10, no echymosis Extremities/Skin: Warm and dry. No clubbing or cyanosis. No edema. No rashes or suspicious lesions. Neuro: Alert and oriented X 3. Moves all extremities spontaneously. Gait is normal. CNII-XII grossly in tact. Psych:  Responds to questions appropriately with a normal affect.   [facility] reading (PRIMARY) by  Dr. Joseph Art Fracture of left ribs, 8 and 9     ASSESSMENT AND PLAN:  54 y.o. year old female with  1. Rib pain on right side   2. Fall, initial encounter    This chart was scribed in my presence and reviewed by me personally.    ICD-9-CM ICD-10-CM   1. Rib pain on left side 786.50 R07.81 HYDROcodone-acetaminophen (NORCO) 5-325 MG per tablet     CANCELED: DG Ribs Unilateral W/Chest Right  2. Fall, initial encounter [phone] W19.Merril Abbe DG Ribs Unilateral W/Chest Left     HYDROcodone-acetaminophen (NORCO) 5-325 MG per tablet     CANCELED: DG Ribs Unilateral W/Chest Right  3. Rib fractures, left, closed, initial encounter 807.00 S22.32XA HYDROcodone-acetaminophen (NORCO) 5-325 MG per tablet  follow up with Dr. Carlota Raspberry  Signed, Robyn Haber, MD 08/22/2014 11:36 AM

## 2014-08-26 ENCOUNTER — Telehealth: Payer: Self-pay

## 2014-08-26 DIAGNOSIS — R0781 Pleurodynia: Secondary | ICD-10-CM

## 2014-08-26 DIAGNOSIS — W19XXXA Unspecified fall, initial encounter: Secondary | ICD-10-CM

## 2014-08-26 DIAGNOSIS — S2232XA Fracture of one rib, left side, initial encounter for closed fracture: Secondary | ICD-10-CM

## 2014-08-26 NOTE — Telephone Encounter (Signed)
° °  Patient is in excruciating pain with two broken ribs.  Having trouble breathing.  The medication given on Saturday is not working and she would like something called into  [place] [facility] [address]  Please call [place] - husband

## 2014-08-26 NOTE — Telephone Encounter (Signed)
Yes, may increase dose to two pills every 8 hours.  Recheck on Saturday

## 2014-08-26 NOTE — Telephone Encounter (Signed)
Dr Joseph Art any suggestions. Can pt take two pills at a time?

## 2014-08-27 ENCOUNTER — Other Ambulatory Visit: Payer: Self-pay | Admitting: *Deleted

## 2014-08-27 MED ORDER — HYDROCODONE-ACETAMINOPHEN 5-325 MG PO TABS: 1.0000 | ORAL_TABLET | Freq: Four times a day (QID) | ORAL | Status: DC | PRN

## 2014-08-27 NOTE — Telephone Encounter (Signed)
Ronalee Belts called back stating if the pt takes two pills she will run out by the time she sees Dr. Carlota Raspberry on Monday the 8th. Can we Rx enough until then? Dr L, I don't know if you want me to send this to Dr. Carlota Raspberry. Please advise.

## 2014-08-27 NOTE — Telephone Encounter (Signed)
I spoke with Kellie Mason and advised message from Dr. Joseph Art. He will try to take tell pt to take 2 pills at a time. Verbalized understanding. They have an appt with Dr. Carlota Raspberry on the 8th.

## 2014-08-27 NOTE — Addendum Note (Signed)
Addended by: Robyn Haber on: 08/27/2014 04:23 PM   Modules accepted: Orders

## 2014-08-28 ENCOUNTER — Telehealth: Payer: Self-pay

## 2014-08-28 NOTE — Telephone Encounter (Signed)
Spoke with Kellie Mason, patients husband. Advised him pain meds for Kellie Mason are ready for him to pickup up at our [address] office. Discussed his comment about "if he has to come down here , it will be ugly". Kellie Mason asked me to apologize to Kellie Mason, his comment was made out of frustration and concern for his wife and he meant [place] no harm.

## 2014-08-28 NOTE — Telephone Encounter (Signed)
I have Rx, awaiting signature.

## 2014-08-28 NOTE — Telephone Encounter (Signed)
Done, see Julie's note

## 2014-08-28 NOTE — Telephone Encounter (Signed)
Rx signed and given to Almyra Free, she will call pt's husband to let him know.

## 2014-08-28 NOTE — Telephone Encounter (Signed)
Pt's spouse calling in regards to previous message.States that Pt is completely out of her HYDROcodone-acetaminophen (NORCO) 5-325 MG per tablet [[id number]] , states he called in yesterday for a refill, and that pt has been taking 2 every 5 hours. I advised him that the request was being worked on, waiting for approval by Dr. Carlean Jews,, and that he would receive a phone call as soon as this has been taken care of. Ronalee Belts demanded that the refill be given, "right now," as he was tried of waiting. I advised him that with this being a controlled substance we had to wait on approval of the prescribing Dr., but if he felt this was an emergent situation they were welcome to come in and be seen by any provider, and they could access the patients situation. Ronalee Belts stated," If I have to come in to the office it is not going to be pretty," and hung up the phone.

## 2014-08-31 ENCOUNTER — Encounter: Payer: Self-pay | Admitting: Family Medicine

## 2014-08-31 ENCOUNTER — Ambulatory Visit (INDEPENDENT_AMBULATORY_CARE_PROVIDER_SITE_OTHER): Payer: PRIVATE HEALTH INSURANCE | Admitting: Family Medicine

## 2014-08-31 VITALS — BP 110/66 | HR 86 | Temp 98.2°F | Resp 16 | Ht 65.0 in | Wt 119.8 lb

## 2014-08-31 DIAGNOSIS — R0781 Pleurodynia: Secondary | ICD-10-CM

## 2014-08-31 DIAGNOSIS — I10 Essential (primary) hypertension: Secondary | ICD-10-CM

## 2014-08-31 DIAGNOSIS — L409 Psoriasis, unspecified: Secondary | ICD-10-CM

## 2014-08-31 DIAGNOSIS — W19XXXA Unspecified fall, initial encounter: Secondary | ICD-10-CM

## 2014-08-31 DIAGNOSIS — S2232XA Fracture of one rib, left side, initial encounter for closed fracture: Secondary | ICD-10-CM

## 2014-08-31 MED ORDER — AMLODIPINE BESYLATE 2.5 MG PO TABS: 2.5000 mg | ORAL_TABLET | Freq: Every day | ORAL | Status: DC

## 2014-08-31 MED ORDER — HYDROCHLOROTHIAZIDE 12.5 MG PO CAPS: 12.5000 mg | ORAL_CAPSULE | ORAL | Status: DC

## 2014-08-31 MED ORDER — HYDROCODONE-ACETAMINOPHEN 5-325 MG PO TABS: 1.0000 | ORAL_TABLET | Freq: Four times a day (QID) | ORAL | Status: DC | PRN

## 2014-08-31 MED ORDER — CLOBETASOL PROPIONATE 0.05 % EX CREA: 1.0000 "application " | TOPICAL_CREAM | Freq: Two times a day (BID) | CUTANEOUS | Status: DC | PRN

## 2014-08-31 NOTE — Progress Notes (Signed)
Subjective:  This chart was scribed for [name], MD by Donato Schultz, Medical Scribe. This patient was seen in Room 28 and the patient's care was started at 5:23 PM.   Patient ID: Kellie Mason, female    DOB: [birth year], 54 y.o.   MRN: [id number]  HPI HPI Comments: Kellie Mason is a 54 y.o. female who presents to the [facility] for:  She had coffee and water today.   1. Hypertension - Her last renal function was normal with creatinine 0.58 in June 2015.  Lipids were slightly elevated.  She does not check her blood pressure at home.  She has not experienced any negative side effects while taking her medication. Lab Results  Component Value Date   CHOL 229* 12/29/2013   HDL 94 12/29/2013   LDLCALC 117* 12/29/2013   TRIG 88 12/29/2013   CHOLHDL 2.4 12/29/2013    2. Rib fractures - She has been seen recently by Dr. Joseph Art for rib fractures and rib pain.  See notes of his visits.  She was seen by Dr. Joseph Art on August 22, 2014.  Slipped, caught the edge of the coffee table the night before.  She had some left sided rib fractures.  She had mildly displaced 7th, 8th, and 9th rib fractures.  No pneumothorax or effusion.  She was treated with Hydrocodone 5mg  every 6 hrs as needed.  Initially given 30 on January 30th and 30 on February 4th.  Per telephone calls, was advised to take 2 pills at a time.  She takes one Hydrocodone every 4 hrs.  She took a total of 4 pills yesterday.  She has not experienced any rashes while taking the Hydrocodone.  Her pain is worse in the morning but has improved since she last saw Dr. Joseph Art.  She states that she feels something poking her in the middle of her back when she lays down and cannot get comfortable.  She tries to cough and take a deep breath every 15 minutes.  She is not able to cough or laugh without reproducing pain.  She lists cough productive of clear sputum and constipation as associated symptoms.  She has been  taking Cholace with relief to her symptoms.  She had a bowel movement today.  She takes Vitamin D regularly but does not take a calcium supplement.  She denies SOB and fever as associated symptoms.  She does not have a history of fractures.  She was diagnosed with osteopenia in her right hip 2 years ago.  Her mother had a history of osteoporosis.    3. History of psoriasis - She has used Clobatosol 0.05% cream as needed.  She has been out of the medication for a year and only takes it when she has a flair up.   Patient Active Problem List   Diagnosis Date Noted  . History of basal cell carcinoma 11/18/2012  . Atopic dermatitis 04/12/2012  . Hypertension 02/06/2011  . Tobacco use disorder 02/06/2011  . Healthcare maintenance 02/06/2011   Past Medical History  Diagnosis Date  . Hypertension   . Dizziness - light-headed   . Chest pain   . Palpitations   . Tobacco abuse     No ready to quit  . Cough   . Basal cell carcinoma of anterior chest     shave bx - 08/12/12 - Dr. Wilhemina Bonito.  . Arthritis    Past Surgical History  Procedure Laterality Date  . Electrocardiogram    .  Knee arthroscopy Left    Allergies  Allergen Reactions  . Cannabinoids   . Tetracyclines & Related   . Codeine Rash   Prior to Admission medications   Medication Sig Start Date End Date Taking? Authorizing Provider  amLODipine (NORVASC) 2.5 MG tablet Take 1 tablet (2.5 mg total) by mouth daily. 07/21/14  Yes Mancel Bale, PA-C  Calcium Carbonate-Vit D-Min (CALTRATE PLUS PO) Take by mouth.     Yes Historical Provider, MD  Fish Oil OIL     Yes Historical Provider, MD  GLUCOSAMINE PO Take 1,200 mg by mouth daily.   Yes Historical Provider, MD  hydrochlorothiazide (MICROZIDE) 12.5 MG capsule Take 1 capsule (12.5 mg total) by mouth every morning. 07/21/14  Yes Mancel Bale, PA-C  HYDROcodone-acetaminophen (NORCO) 5-325 MG per tablet Take 1 tablet by mouth every 6 (six) hours as needed for moderate pain. 08/27/14   Yes Robyn Haber, MD  OVER THE COUNTER MEDICATION OTC Niacin taking   Yes Historical Provider, MD   History   Social History  . Marital Status: Married    Spouse Name: N/A    Number of Children: N/A  . Years of Education: N/A   Occupational History  . Works for Tree surgeon   Social History Main Topics  . Smoking status: Former Smoker -- 0.50 packs/day  . Smokeless tobacco: Not on file  . Alcohol Use: 1.0 oz/week    2 drink(s) per week  . Drug Use: No  . Sexual Activity: Yes   Other Topics Concern  . Not on file   Social History Narrative   Married. Exercises 5 days per week...speed walking, 3-4 miles     Review of Systems  Constitutional: Negative for fever.  Respiratory: Positive for cough. Negative for shortness of breath.   Gastrointestinal: Positive for constipation.  Musculoskeletal: Positive for arthralgias.  Skin: Positive for rash.     Objective:  Physical Exam  Constitutional: She is oriented to person, place, and time. She appears well-developed and well-nourished.  HENT:  Head: Normocephalic and atraumatic.  Eyes: Conjunctivae and EOM are normal. Pupils are equal, round, and reactive to light.  Neck: Carotid bruit is not present.  Cardiovascular: Normal rate, regular rhythm, normal heart sounds and intact distal pulses.  Exam reveals no gallop and no friction rub.   No murmur heard. Pulmonary/Chest: Effort normal and breath sounds normal. No respiratory distress. She has no wheezes. She has no rales.  Lungs are clear posteriorly.  Pain with deep inspiration.  Lateral chest wall diffusely tender.    Abdominal: Soft. She exhibits no pulsatile midline mass. There is no tenderness.  Musculoskeletal:  No midline bony tenderness over the thoracic spine.  There is no sub-q emphysema.   Neurological: She is alert and oriented to person, place, and time.  Skin: Skin is warm and dry. Rash noted.  Excoriated with some thickened  scales on her right lateral scalp.  No other rash.  Psychiatric: She has a normal mood and affect. Her behavior is normal.  Vitals reviewed.    Filed Vitals:   08/31/14 1630  BP: 110/66  Pulse: 86  Temp: 98.2 F (36.8 C)  TempSrc: Oral  Resp: 16  Height: 5\' 5"  (1.651 m)  Weight: 119 lb 12.8 oz (54.341 kg)  SpO2: 97%   Assessment & Plan:   Kellie Mason is a 54 y.o. female Rib pain on left side, Rib fractures, left, closed, initial encounter - Plan:  HYDROcodone-acetaminophen (Delta) 5-325 MG per tablet, BASIC METABOLIC PANEL WITH GFR  - refill given for hydrocodone when needed.  Stool softener and miralax if needed due to opiate induced constipation. Cont sx care for rib pain, wean meds as tolerated., rtc/er precautions.   Essential hypertension - Plan: hydrochlorothiazide (MICROZIDE) 12.5 MG capsule, amLODipine (NORVASC) 2.5 MG tablet, BASIC METABOLIC PANEL WITH GFR  -stable. BMP pending, meds refilled.  Psoriasis of scalp - Plan: clobetasol cream (TEMOVATE) 8.46 %, BASIC METABOLIC PANEL WITH GFR  -temovate refilled to use with flair. rtc if persists or may need derm eval.   Meds ordered this encounter  Medications  . HYDROcodone-acetaminophen (NORCO) 5-325 MG per tablet    Sig: Take 1 tablet by mouth every 6 (six) hours as needed for moderate pain.    Dispense:  30 tablet    Refill:  0    May fill 09/03/14  . hydrochlorothiazide (MICROZIDE) 12.5 MG capsule    Sig: Take 1 capsule (12.5 mg total) by mouth every morning.    Dispense:  90 capsule    Refill:  1  . amLODipine (NORVASC) 2.5 MG tablet    Sig: Take 1 tablet (2.5 mg total) by mouth daily.    Dispense:  90 tablet    Refill:  1  . clobetasol cream (TEMOVATE) 0.05 %    Sig: Apply 1 application topically 2 (two) times daily as needed.    Dispense:  60 g    Refill:  1   Patient Instructions  Colace as stool softener. If no bowel movement on 2nd day - take Miralax.  Hydrocodone as needed for rib pain, but can  wean this as tolerated.  Call your OBGYN about repeat bone density testing to make sure this has not changed.  Over the counter calcium 1200-1500mg  per day and vitamin D 800 units per day.  You should receive a call or letter about your lab results within the next week to 10 days.      I personally performed the services described in this documentation, which was scribed in my presence. The recorded information has been reviewed and considered, and addended by me as needed.

## 2014-08-31 NOTE — Patient Instructions (Signed)
Colace as stool softener. If no bowel movement on 2nd day - take Miralax.  Hydrocodone as needed for rib pain, but can wean this as tolerated.  Call your OBGYN about repeat bone density testing to make sure this has not changed.  Over the counter calcium 1200-1500mg  per day and vitamin D 800 units per day.  You should receive a call or letter about your lab results within the next week to 10 days.

## 2014-09-01 LAB — BASIC METABOLIC PANEL WITH GFR
BUN: 13 mg/dL (ref 6–23)
CALCIUM: 9.8 mg/dL (ref 8.4–10.5)
CO2: 26 mEq/L (ref 19–32)
Chloride: 102 mEq/L (ref 96–112)
Creat: 0.7 mg/dL (ref 0.50–1.10)
GFR, Est Non African American: >89 mL/min
GLUCOSE: 96 mg/dL (ref 70–99)
Potassium: 4.7 mEq/L (ref 3.5–5.3)
Sodium: 138 mEq/L (ref 135–145)

## 2015-03-01 ENCOUNTER — Encounter: Payer: Self-pay | Admitting: Family Medicine

## 2015-03-01 ENCOUNTER — Ambulatory Visit (INDEPENDENT_AMBULATORY_CARE_PROVIDER_SITE_OTHER): Payer: PRIVATE HEALTH INSURANCE | Admitting: Family Medicine

## 2015-03-01 VITALS — BP 161/85 | HR 67 | Temp 97.4°F | Resp 16 | Wt 118.6 lb

## 2015-03-01 DIAGNOSIS — I10 Essential (primary) hypertension: Secondary | ICD-10-CM | POA: Diagnosis not present

## 2015-03-01 MED ORDER — HYDROCHLOROTHIAZIDE 12.5 MG PO CAPS: 12.5000 mg | ORAL_CAPSULE | ORAL | Status: DC

## 2015-03-01 MED ORDER — AMLODIPINE BESYLATE 2.5 MG PO TABS: 2.5000 mg | ORAL_TABLET | Freq: Every day | ORAL | Status: DC

## 2015-03-01 NOTE — Patient Instructions (Signed)
Keep a record of your blood pressures outside of the office this week.  Both arms and check every day. If pressures run over 140/90  - call me and we can discuss med changes.  Try setting an alarm on your phone to help remember medications on the weekends.  Return in next week for fasting blood work only. You should receive a call or letter about your lab results within the next week to 10 days (once drawn).  Return to the clinic or go to the nearest emergency room if any of your symptoms worsen or new symptoms occur.

## 2015-03-01 NOTE — Progress Notes (Signed)
Subjective:  This chart was scribed for Kellie Ray, MD by Thea Alken, ED Scribe. This patient was seen in room 25 and the patient's care was started at 5:08 PM.  Patient ID: Kellie Mason, female    DOB: November 03, 1960, 54 y.o.   MRN: [id number]  HPI   Chief Complaint  Patient presents with  . Hypertension   HPI Comments:  Kellie Mason is a 54 y.o. female who presents to the [facility] for HTN follow up. Her last visit was 08/2014. BP 110/66. Was continued on norvasc 2.5 mg and HCTZ 12.5 mg. Pt is tolerating medication well but admits to being non compliant with her medications on the weekends. She leaves her medication next her tooth brush. She is asymptomatic with medication. She denies CP, dizziness, and light headedness.   Lab Results  Component Value Date   CREATININE 0.70 08/31/2014   Pt reports trouble eating due to it being hot outside. She instead drinks smoothies but state they have been causing nausea.  Patient Active Problem List   Diagnosis Date Noted  . History of basal cell carcinoma 11/18/2012  . Atopic dermatitis 04/12/2012  . Hypertension 02/06/2011  . Tobacco use disorder 02/06/2011  . Healthcare maintenance 02/06/2011   Past Medical History  Diagnosis Date  . Hypertension   . Dizziness - light-headed   . Chest pain   . Palpitations   . Tobacco abuse     No ready to quit  . Cough   . Basal cell carcinoma of anterior chest     shave bx - 08/12/12 - Dr. Wilhemina Bonito.  . Arthritis    Past Surgical History  Procedure Laterality Date  . Electrocardiogram    . Knee arthroscopy Left    Allergies  Allergen Reactions  . Cannabinoids   . Tetracyclines & Related   . Codeine Rash   Prior to Admission medications   Medication Sig Start Date End Date Taking? Authorizing Provider  amLODipine (NORVASC) 2.5 MG tablet Take 1 tablet (2.5 mg total) by mouth daily. 08/31/14  Yes Wendie Agreste, MD  Calcium Carbonate-Vit D-Min (CALTRATE PLUS  PO) Take by mouth.     Yes Historical Provider, MD  clobetasol cream (TEMOVATE) 4.19 % Apply 1 application topically 2 (two) times daily as needed. 08/31/14  Yes Wendie Agreste, MD  Fish Oil OIL     Yes Historical Provider, MD  GLUCOSAMINE PO Take 1,200 mg by mouth daily.   Yes Historical Provider, MD  hydrochlorothiazide (MICROZIDE) 12.5 MG capsule Take 1 capsule (12.5 mg total) by mouth every morning. 08/31/14  Yes Wendie Agreste, MD  HYDROcodone-acetaminophen (NORCO) 5-325 MG per tablet Take 1 tablet by mouth every 6 (six) hours as needed for moderate pain. 08/31/14  Yes Wendie Agreste, MD  OVER THE COUNTER MEDICATION OTC Niacin taking   Yes Historical Provider, MD   History   Social History  . Marital Status: Married    Spouse Name: N/A  . Number of Children: N/A  . Years of Education: N/A   Occupational History  . Works for [employer]   Social History Main Topics  . Smoking status: Current Every Day Smoker -- 0.50 packs/day  . Smokeless tobacco: Not on file  . Alcohol Use: 1.2 oz/week    2 Standard drinks or equivalent per week  . Drug Use: No  . Sexual Activity: Yes   Other Topics Concern  .  Not on file   Social History Narrative   Married. Exercises 5 days per week...speed walking, 3-4 miles   Review of Systems  Constitutional: Negative for fatigue and unexpected weight change.  Respiratory: Negative for chest tightness and shortness of breath.   Cardiovascular: Negative for chest pain, palpitations and leg swelling.  Gastrointestinal: Negative for abdominal pain and blood in stool.  Neurological: Negative for dizziness, syncope, light-headedness and headaches.   Objective:   Physical Exam  Constitutional: She is oriented to person, place, and time. She appears well-developed and well-nourished.  HENT:  Head: Normocephalic and atraumatic.  Eyes: Conjunctivae and EOM are normal. Pupils are equal, round, and reactive to light.  Neck:  Carotid bruit is not present.  Cardiovascular: Normal rate, regular rhythm, normal heart sounds and intact distal pulses.   BP left arm 170/92 Right arm 158/80 small cuff   Pulmonary/Chest: Effort normal and breath sounds normal.  Abdominal: Soft. She exhibits no pulsatile midline mass. There is no tenderness.  Neurological: She is alert and oriented to person, place, and time.  Skin: Skin is warm and dry.  Psychiatric: She has a normal mood and affect. Her behavior is normal.  Nursing note and vitals reviewed.  Filed Vitals:   03/01/15 1627  BP: 161/85  Pulse: 67  Temp: 97.4 F (36.3 C)  TempSrc: Oral  Resp: 16  Weight: 118 lb 9.6 oz (53.797 kg)    Assessment & Plan:   Kellie Mason is a 54 y.o. female Essential hypertension - Plan: COMPLETE METABOLIC PANEL WITH GFR, Lipid panel, amLODipine (NORVASC) 2.5 MG tablet, hydrochlorothiazide (MICROZIDE) 12.5 MG capsule  -elevated readings here, controlled prior and misses dose on weekends - may be residual elevation from this weekend's missed doses.   -discussed setting alarm on phone to help remember dosing on W/E.   -check home readings next 1 week and if remain elevated - call to discuss med changes.   -CMP, lipid panel as fasting lab visit in next week.    Meds ordered this encounter  Medications  . amLODipine (NORVASC) 2.5 MG tablet    Sig: Take 1 tablet (2.5 mg total) by mouth daily.    Dispense:  90 tablet    Refill:  1  . hydrochlorothiazide (MICROZIDE) 12.5 MG capsule    Sig: Take 1 capsule (12.5 mg total) by mouth every morning.    Dispense:  90 capsule    Refill:  1   Patient Instructions  Keep a record of your blood pressures outside of the office this week.  Both arms and check every day. If pressures run over 140/90  - call me and we can discuss med changes.  Try setting an alarm on your phone to help remember medications on the weekends.  Return in next week for fasting blood work only. You should receive a call  or letter about your lab results within the next week to 10 days (once drawn).  Return to the clinic or go to the nearest emergency room if any of your symptoms worsen or new symptoms occur.     I personally performed the services described in this documentation, which was scribed in my presence. The recorded information has been reviewed and considered, and addended by me as needed.        I personally performed the services described in this documentation, which was scribed in my presence. The recorded information has been reviewed and considered, and addended by me as needed.

## 2015-03-23 ENCOUNTER — Other Ambulatory Visit: Payer: Self-pay | Admitting: Obstetrics and Gynecology

## 2015-03-24 LAB — CYTOLOGY - PAP

## 2015-04-09 ENCOUNTER — Telehealth: Payer: Self-pay

## 2015-04-09 NOTE — Telephone Encounter (Signed)
Pt needs a refill on hydrochlorothiazide (MICROZIDE) and amLODipine (NORVASC)  She only has 2 pills left.  Please advise   [phone]

## 2015-04-09 NOTE — Telephone Encounter (Signed)
Spoke with pt, she has refills on the original Rx. Called pt and told her to check with her pharmacy.

## 2015-05-03 ENCOUNTER — Telehealth: Payer: Self-pay | Admitting: Family Medicine

## 2015-05-03 NOTE — Telephone Encounter (Signed)
LMOM OF PATIENT NEW APPOINTMENT TIME ON 09/06/15 @ 3:15

## 2015-05-17 ENCOUNTER — Telehealth: Payer: Self-pay | Admitting: Family Medicine

## 2015-05-17 NOTE — Telephone Encounter (Signed)
Kellie Mason came in saying for four days she's experienced Rt Ear pain and facial numbness. She said there's some discomfort on the left side also but mostly it's on the right side. She's wondering if she should wait to see Dr. Carlota Raspberry when he's at 102 on Thursday or if she needs to be seen sooner. She's very concerned about these symptoms. She'd like a phone call. Pt ph# [phone]  Side Note: Pt wants Dr. Carlota Raspberry to know her husband is she "great golfer" he played with. Thank you.

## 2015-05-20 NOTE — Telephone Encounter (Signed)
lmom that she needs to come on in to be evaluated.  Left schedule for Dr. Carlota Raspberry today and tom.

## 2015-06-04 IMAGING — MG MM DIAGNOSTIC UNILATERAL L
2 series · 2 of 2 positions shown · non-contrast
Comparison: Multiple prior studies including 08/28/2013

CLINICAL DATA: Further evaluation of left upper outer quadrant
calcifications

EXAM:
DIGITAL DIAGNOSTIC  LEFT MAMMOGRAM

[L ML]
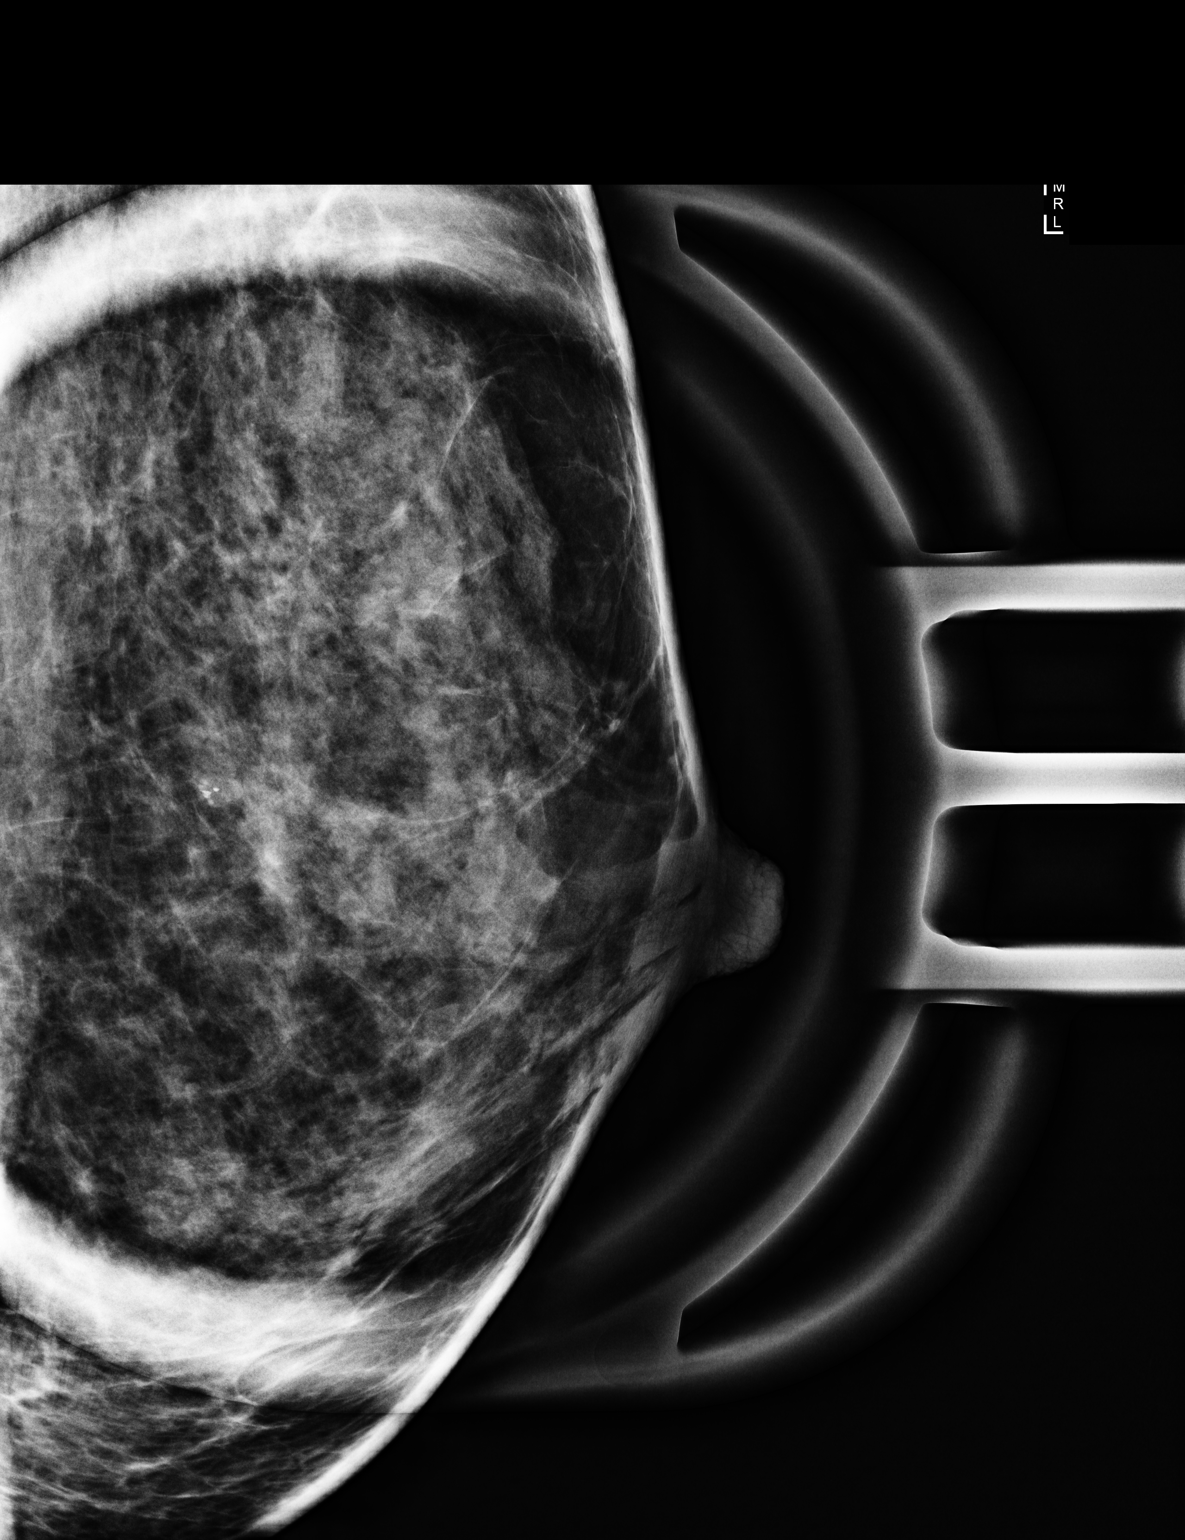

[L CC]
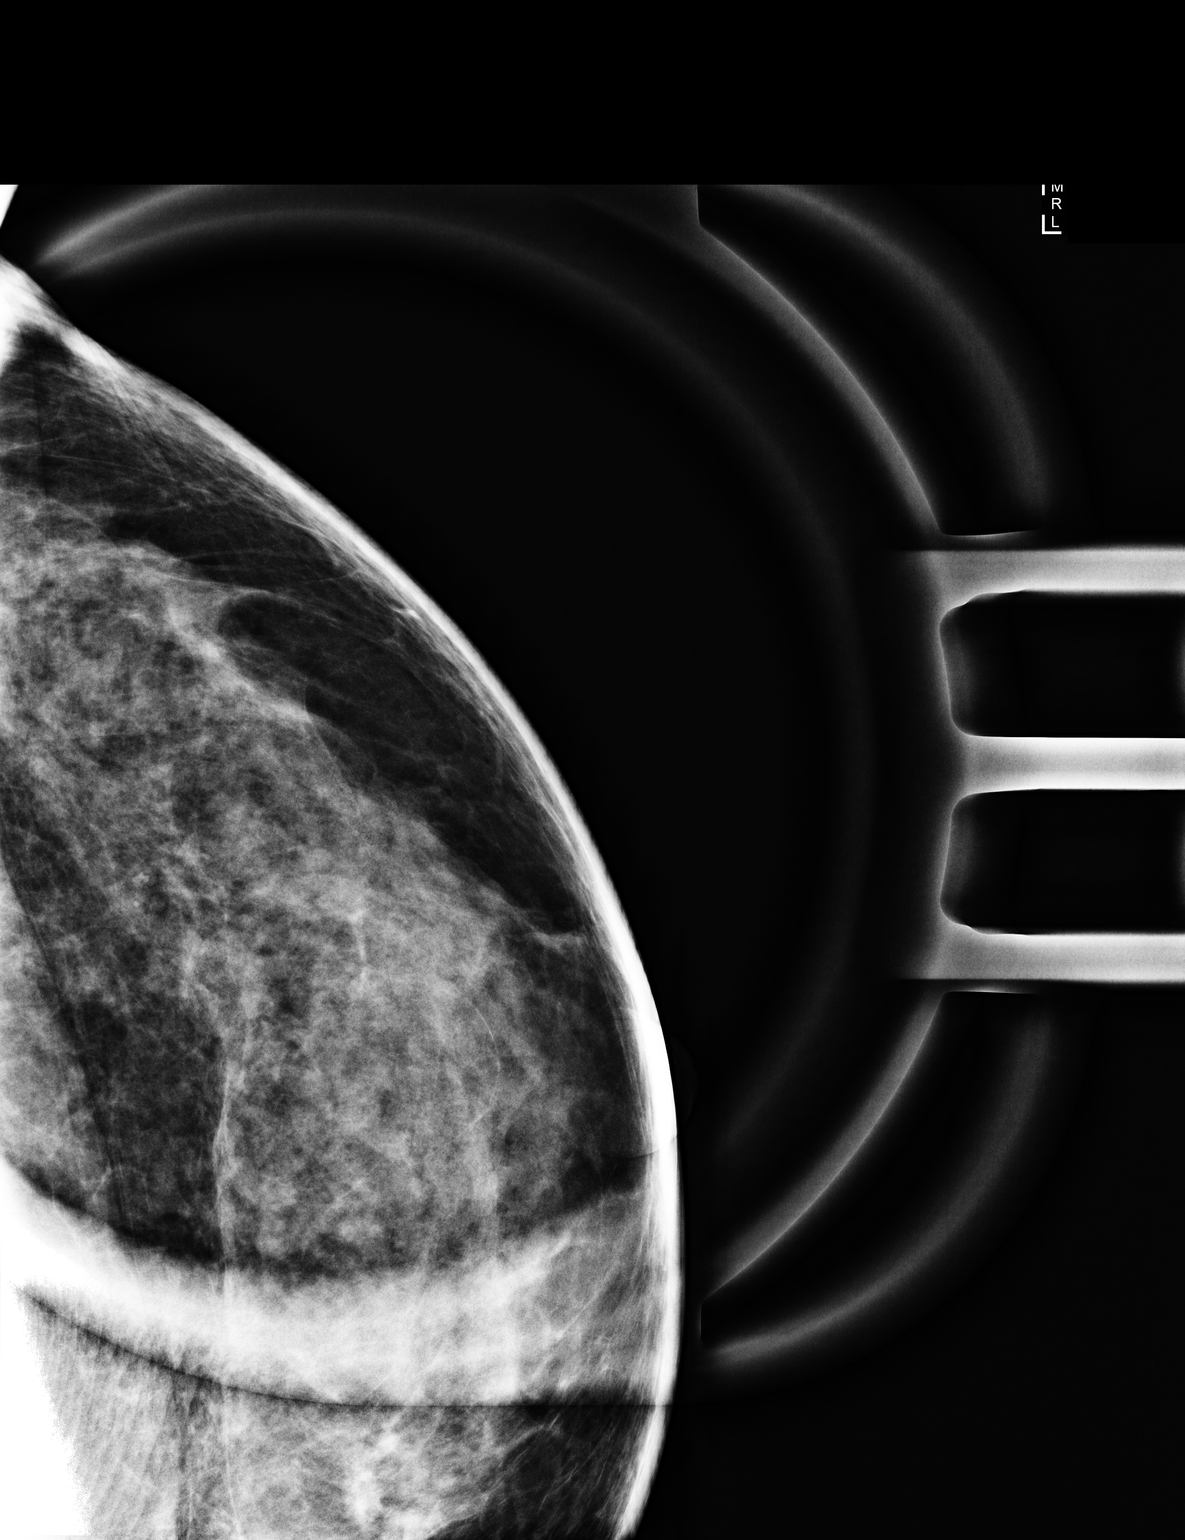

[2 of 2 positions shown; findings below may reference images not displayed]

ACR Breast Density Category d: The breast tissue is extremely dense,
which lowers the sensitivity of mammography.
FINDINGS: There is a 2mm cluster of four calcifications posteriorly in the 3
o'clock position of the left breast. These are very indistinct on
the magnified craniocaudal image. On the magnified true lateral
view, the calcifications adopt a teacup well-defined appearance.
IMPRESSION: Milk of calcium

RECOMMENDATION:
Diagnostic left mammogram with magnified views in 6 months

I have discussed the findings and recommendations with the patient.
Results were also provided in writing at the conclusion of the
visit. If applicable, a reminder letter will be sent to the patient
regarding the next appointment.

BI-RADS CATEGORY  3: Probably benign finding(s) - short interval
follow-up suggested.

## 2015-09-06 ENCOUNTER — Ambulatory Visit: Payer: Self-pay | Admitting: Family Medicine

## 2015-09-08 ENCOUNTER — Encounter: Payer: Self-pay | Admitting: Family Medicine

## 2015-09-08 ENCOUNTER — Ambulatory Visit (INDEPENDENT_AMBULATORY_CARE_PROVIDER_SITE_OTHER): Payer: 59 | Admitting: Family Medicine

## 2015-09-08 VITALS — BP 150/84 | HR 84 | Temp 98.4°F | Resp 16 | Ht 64.75 in | Wt 121.6 lb

## 2015-09-08 DIAGNOSIS — N3943 Post-void dribbling: Secondary | ICD-10-CM

## 2015-09-08 DIAGNOSIS — F32A Depression, unspecified: Secondary | ICD-10-CM

## 2015-09-08 DIAGNOSIS — F329 Major depressive disorder, single episode, unspecified: Secondary | ICD-10-CM | POA: Diagnosis not present

## 2015-09-08 DIAGNOSIS — Z13220 Encounter for screening for lipoid disorders: Secondary | ICD-10-CM

## 2015-09-08 DIAGNOSIS — I10 Essential (primary) hypertension: Secondary | ICD-10-CM

## 2015-09-08 DIAGNOSIS — F4323 Adjustment disorder with mixed anxiety and depressed mood: Secondary | ICD-10-CM | POA: Diagnosis not present

## 2015-09-08 DIAGNOSIS — R3911 Hesitancy of micturition: Secondary | ICD-10-CM | POA: Diagnosis not present

## 2015-09-08 LAB — POC MICROSCOPIC URINALYSIS (UMFC): Mucus: ABSENT

## 2015-09-08 LAB — POCT URINALYSIS DIP (MANUAL ENTRY)
Bilirubin, UA: NEGATIVE
Glucose, UA: NEGATIVE
Ketones, POC UA: NEGATIVE
Leukocytes, UA: NEGATIVE
Nitrite, UA: NEGATIVE
Protein Ur, POC: NEGATIVE
Spec Grav, UA: >=1.03
Urobilinogen, UA: 0.2
pH, UA: 6

## 2015-09-08 MED ORDER — AMLODIPINE BESYLATE 5 MG PO TABS: 2.5000 mg | ORAL_TABLET | Freq: Every day | ORAL | Status: DC

## 2015-09-08 MED ORDER — HYDROCHLOROTHIAZIDE 12.5 MG PO CAPS: 12.5000 mg | ORAL_CAPSULE | ORAL | Status: DC

## 2015-09-08 MED ORDER — FLUOXETINE HCL 20 MG PO TABS: 20.0000 mg | ORAL_TABLET | Freq: Every day | ORAL | Status: DC
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
## 2015-09-08 NOTE — Patient Instructions (Addendum)
Try tylenol for hip pain, and warm bath at night. If this does not help the pain at night, call your orthopaedist for follow up.  Start prozac once per day, call one of the counselors below to schedule appointment. Let me know if you do not get thorough and I will provide other numbers: Vivia Budge: [place]: [phone] [place]: [phone]  Follow up with me in next 3-4 weeks.   You still have some blood in the urine, but urinary symptoms may be related to anxiety or depression symptoms. If still having difficulty with urination in few weeks at follow up, would recommend follow up with urology.   Return to the clinic or go to the nearest emergency room if any of your symptoms worsen or new symptoms occur.   Major Depressive Disorder Major depressive disorder is a mental illness. It also may be called clinical depression or unipolar depression. Major depressive disorder usually causes feelings of sadness, hopelessness, or helplessness. Some people with this disorder do not feel particularly sad but lose interest in doing things they used to enjoy (anhedonia). Major depressive disorder also can cause physical symptoms. It can interfere with work, school, relationships, and other normal everyday activities. The disorder varies in severity but is longer lasting and more serious than the sadness we all feel from time to time in our lives. Major depressive disorder often is triggered by stressful life events or major life changes. Examples of these triggers include divorce, loss of your job or home, a move, and the death of a family member or close friend. Sometimes this disorder occurs for no obvious reason at all. People who have family members with major depressive disorder or bipolar disorder are at higher risk for developing this disorder, with or without life stressors. Major depressive disorder can occur at any age. It may occur just once in your life (single episode major depressive  disorder). It may occur multiple times (recurrent major depressive disorder). SYMPTOMS People with major depressive disorder have either anhedonia or depressed mood on nearly a daily basis for at least 2 weeks or longer. Symptoms of depressed mood include:  Feelings of sadness (blue or down in the dumps) or emptiness.  Feelings of hopelessness or helplessness.  Tearfulness or episodes of crying (may be observed by others).  Irritability (children and adolescents). In addition to depressed mood or anhedonia or both, people with this disorder have at least four of the following symptoms:  Difficulty sleeping or sleeping too much.   Significant change (increase or decrease) in appetite or weight.   Lack of energy or motivation.  Feelings of guilt and worthlessness.   Difficulty concentrating, remembering, or making decisions.  Unusually slow movement (psychomotor retardation) or restlessness (as observed by others).   Recurrent wishes for death, recurrent thoughts of self-harm (suicide), or a suicide attempt. People with major depressive disorder commonly have persistent negative thoughts about themselves, other people, and the world. People with severe major depressive disorder may experiencedistorted beliefs or perceptions about the world (psychotic delusions). They also may see or hear things that are not real (psychotic hallucinations). DIAGNOSIS Major depressive disorder is diagnosed through an assessment by your health care provider. Your health care provider will ask aboutaspects of your daily life, such as mood,sleep, and appetite, to see if you have the diagnostic symptoms of major depressive disorder. Your health care provider may ask about your medical history and use of alcohol or drugs, including prescription medicines. Your health care provider also  may do a physical exam and blood work. This is because certain medical conditions and the use of certain substances can  cause major depressive disorder-like symptoms (secondary depression). Your health care provider also may refer you to a mental health specialist for further evaluation and treatment. TREATMENT It is important to recognize the symptoms of major depressive disorder and seek treatment. The following treatments can be prescribed for this disorder:   Medicine. Antidepressant medicines usually are prescribed. Antidepressant medicines are thought to correct chemical imbalances in the brain that are commonly associated with major depressive disorder. Other types of medicine may be added if the symptoms do not respond to antidepressant medicines alone or if psychotic delusions or hallucinations occur.  Talk therapy. Talk therapy can be helpful in treating major depressive disorder by providing support, education, and guidance. Certain types of talk therapy also can help with negative thinking (cognitive behavioral therapy) and with relationship issues that trigger this disorder (interpersonal therapy). A mental health specialist can help determine which treatment is best for you. Most people with major depressive disorder do well with a combination of medicine and talk therapy. Treatments involving electrical stimulation of the brain can be used in situations with extremely severe symptoms or when medicine and talk therapy do not work over time. These treatments include electroconvulsive therapy, transcranial magnetic stimulation, and vagal nerve stimulation.   This information is not intended to replace advice given to you by your health care provider. Make sure you discuss any questions you have with your health care provider.   Document Released: 11/04/2012 Document Revised: 07/31/2014 Document Reviewed: 11/04/2012 Elsevier Interactive Patient Education 2016 [place] and Stress Management Stress is a normal reaction to life events. It is what you feel when life demands more than you are used  to or more than you can handle. Some stress can be useful. For example, the stress reaction can help you catch the last bus of the day, study for a test, or meet a deadline at work. But stress that occurs too often or for too long can cause problems. It can affect your emotional health and interfere with relationships and normal daily activities. Too much stress can weaken your immune system and increase your risk for physical illness. If you already have a medical problem, stress can make it worse. CAUSES  All sorts of life events may cause stress. An event that causes stress for one person may not be stressful for another person. Major life events commonly cause stress. These may be positive or negative. Examples include losing your job, moving into a new home, getting married, having a baby, or losing a loved one. Less obvious life events may also cause stress, especially if they occur day after day or in combination. Examples include working long hours, driving in traffic, caring for children, being in debt, or being in a difficult relationship. SIGNS AND SYMPTOMS Stress may cause emotional symptoms including, the following:  Anxiety. This is feeling worried, afraid, on edge, overwhelmed, or out of control.  Anger. This is feeling irritated or impatient.  Depression. This is feeling sad, down, helpless, or guilty.  Difficulty focusing, remembering, or making decisions. Stress may cause physical symptoms, including the following:   Aches and pains. These may affect your head, neck, back, stomach, or other areas of your body.  Tight muscles or clenched jaw.  Low energy or trouble sleeping. Stress may cause unhealthy behaviors, including the following:   Eating to feel better (overeating) or  skipping meals.  Sleeping too little, too much, or both.  Working too much or putting off tasks (procrastination).  Smoking, drinking alcohol, or using drugs to feel better. DIAGNOSIS  Stress is  diagnosed through an assessment by your health care provider. Your health care provider will ask questions about your symptoms and any stressful life events.Your health care provider will also ask about your medical history and may order blood tests or other tests. Certain medical conditions and medicine can cause physical symptoms similar to stress. Mental illness can cause emotional symptoms and unhealthy behaviors similar to stress. Your health care provider may refer you to a mental health professional for further evaluation.  TREATMENT  Stress management is the recommended treatment for stress.The goals of stress management are reducing stressful life events and coping with stress in healthy ways.  Techniques for reducing stressful life events include the following:  Stress identification. Self-monitor for stress and identify what causes stress for you. These skills may help you to avoid some stressful events.  Time management. Set your priorities, keep a calendar of events, and learn to say "no." These tools can help you avoid making too many commitments. Techniques for coping with stress include the following:  Rethinking the problem. Try to think realistically about stressful events rather than ignoring them or overreacting. Try to find the positives in a stressful situation rather than focusing on the negatives.  Exercise. Physical exercise can release both physical and emotional tension. The key is to find a form of exercise you enjoy and do it regularly.  Relaxation techniques. These relax the body and mind. Examples include yoga, meditation, tai chi, biofeedback, deep breathing, progressive muscle relaxation, listening to music, being out in nature, journaling, and other hobbies. Again, the key is to find one or more that you enjoy and can do regularly.  Healthy lifestyle. Eat a balanced diet, get plenty of sleep, and do not smoke. Avoid using alcohol or drugs to relax.  Strong  support network. Spend time with family, friends, or other people you enjoy being around.Express your feelings and talk things over with someone you trust. Counseling or talktherapy with a mental health professional may be helpful if you are having difficulty managing stress on your own. Medicine is typically not recommended for the treatment of stress.Talk to your health care provider if you think you need medicine for symptoms of stress. HOME CARE INSTRUCTIONS  Keep all follow-up visits as directed by your health care provider.  Take all medicines as directed by your health care provider. SEEK MEDICAL CARE IF:  Your symptoms get worse or you start having new symptoms.  You feel overwhelmed by your problems and can no longer manage them on your own. SEEK IMMEDIATE MEDICAL CARE IF:  You feel like hurting yourself or someone else.   This information is not intended to replace advice given to you by your health care provider. Make sure you discuss any questions you have with your health care provider.   Document Released: 01/03/2001 Document Revised: 07/31/2014 Document Reviewed: 03/04/2013 Elsevier Interactive Patient Education [employer].

## 2015-09-09 LAB — COMPLETE METABOLIC PANEL WITH GFR
ALT: 23 U/L (ref 6–29)
AST: 23 U/L (ref 10–35)
Albumin: 4.9 g/dL (ref 3.6–5.1)
Alkaline Phosphatase: 73 U/L (ref 33–130)
BILIRUBIN TOTAL: 0.4 mg/dL (ref 0.2–1.2)
BUN: 14 mg/dL (ref 7–25)
CHLORIDE: 99 mmol/L (ref 98–110)
CO2: 26 mmol/L (ref 20–31)
Calcium: 10.4 mg/dL (ref 8.6–10.4)
Creat: 0.74 mg/dL (ref 0.50–1.05)
GLUCOSE: 94 mg/dL (ref 65–99)
POTASSIUM: 3.6 mmol/L (ref 3.5–5.3)
SODIUM: 137 mmol/L (ref 135–146)
Total Protein: 7.7 g/dL (ref 6.1–8.1)

## 2015-09-09 LAB — LIPID PANEL
CHOL/HDL RATIO: 2.6 ratio (ref <=5.0)
Cholesterol: 250 mg/dL — ABNORMAL HIGH (ref 125–200)
HDL: 98 mg/dL (ref >=46)
LDL CALC: 127 mg/dL (ref <130)
TRIGLYCERIDES: 123 mg/dL (ref <150)
VLDL: 25 mg/dL (ref <30)

## 2015-09-09 LAB — TSH: TSH: 2.38 m[IU]/L

## 2015-09-25 ENCOUNTER — Encounter: Payer: Self-pay | Admitting: *Deleted

## 2015-09-29 ENCOUNTER — Ambulatory Visit (INDEPENDENT_AMBULATORY_CARE_PROVIDER_SITE_OTHER): Payer: 59 | Admitting: Family Medicine

## 2015-09-29 ENCOUNTER — Encounter: Payer: Self-pay | Admitting: Family Medicine

## 2015-09-29 VITALS — BP 130/80 | HR 88 | Temp 98.5°F | Resp 16 | Ht 65.0 in | Wt 121.0 lb

## 2015-09-29 DIAGNOSIS — F4323 Adjustment disorder with mixed anxiety and depressed mood: Secondary | ICD-10-CM

## 2015-09-29 DIAGNOSIS — N3943 Post-void dribbling: Secondary | ICD-10-CM | POA: Diagnosis not present

## 2015-09-29 DIAGNOSIS — R319 Hematuria, unspecified: Secondary | ICD-10-CM

## 2015-09-29 DIAGNOSIS — Z720 Tobacco use: Secondary | ICD-10-CM | POA: Diagnosis not present

## 2015-09-29 DIAGNOSIS — I10 Essential (primary) hypertension: Secondary | ICD-10-CM

## 2015-09-29 DIAGNOSIS — G5760 Lesion of plantar nerve, unspecified lower limb: Secondary | ICD-10-CM | POA: Diagnosis not present

## 2015-09-29 MED ORDER — AMLODIPINE BESYLATE 2.5 MG PO TABS: 2.5000 mg | ORAL_TABLET | Freq: Every day | ORAL | Status: DC

## 2015-09-29 NOTE — Patient Instructions (Signed)
Continue counseling with life coach. Ok to hold on new meds for this for now.   I will refer you to urology for urinary dribbling and blood in urine, and neurology for evaluation of numbness and tingling in feet.   Ok to return to 2.5mg  pill of amlodipine once per day. Keep a record of your blood pressures outside of the office and if over 140/90 - increase to 2 pills per day (5mg ).   Follow up in next 6 months - sooner if worse.

## 2015-09-29 NOTE — Progress Notes (Signed)
Subjective:    Patient ID: Kellie Mason, female    DOB: October 19, 1960, 55 y.o.   MRN: [id number] By signing my name below, I, Zola Button, attest that this documentation has been prepared under the direction and in the presence of [name], MD.  Electronically Signed: Zola Button, Medical Scribe. 09/29/2015. 4:47 PM.  HPI HPI Comments: Kellie Mason is a 55 y.o. female with a history of hypertension who presents to the [facility] for a follow-up.  Depression/adjustment disorder: See last visit. Suspected adjustment with mixed depression and anxiety symptoms. Did note symptoms for over a year. Decided to try Prozac 20 mg qd. Phone numbers for counselors. TSH was checked which was normal. She now has a life coach Psychologist, forensic, originally from [place]); she has seen her twice and this has been going well. Patient confronted her coworker last Friday and earlier today which made her feel better. She does not feel stressful or anxious at work currently. She stopped taking the Prozac after 7 days because it made her feel sleepy and also gave her palpitations, nausea, and loss of appetite. Her husband is much more supportive now. She feels she is managing things fine without the Prozac.  Hypertension: Uncontrolled last visit. Increased amlodipine to 5 mg daily. Continued HCTZ same dose. Labs overall normal. She is currently taking 1/2 of the 5 mg of amlodipine still because that was printed in the instructions. She has not been checking her blood sugars at home.  Hyperlipidemia: Borderline cholesterol. She is not on a statin. Of note, her HDL was 98 placing her at a decreased CVD risk. 10 year ASCVD risk 5.5. Lab Results  Component Value Date   CHOL 250* 09/08/2015   HDL 98 09/08/2015   LDLCALC 127 09/08/2015   TRIG 123 09/08/2015   CHOLHDL 2.6 09/08/2015    Urinary hesitancy and dribbling: with history of hematuria. Hematuria was noted at last visit. Suspected some  urgency/hesitancy psychogenic as noted during her work day. She was been evaluated by urology in the past for hematuria, but not recently. Patient is still having the urinary dribbling. She denies urinary incontinence and urinary urgency. She has not seen her urologist, Dr. Gaynelle Arabian. Patient denies abdominal pain, fever, and gross hematuria. She is still smoking.  Foot numbness: Patient has had numbness and tingling to the bottom of both feet for the past year. She describes the sensation as if her socks have rolled up under her toes. Patient believes this may be hereditary as her mother has had a similar problem. She denies history of diabetes. She has a history of Raynaud's.   Patient Active Problem List   Diagnosis Date Noted  . History of basal cell carcinoma 11/18/2012  . Atopic dermatitis 04/12/2012  . Hypertension 02/06/2011  . Tobacco use disorder 02/06/2011  . Healthcare maintenance 02/06/2011   Past Medical History  Diagnosis Date  . Hypertension   . Dizziness - light-headed   . Chest pain   . Palpitations   . Tobacco abuse     No ready to quit  . Cough   . Basal cell carcinoma of anterior chest     shave bx - 08/12/12 - Dr. Wilhemina Bonito.  . Arthritis    Past Surgical History  Procedure Laterality Date  . Electrocardiogram    . Knee arthroscopy Left    Allergies  Allergen Reactions  . Cannabinoids   . Tetracyclines & Related   . Codeine Rash   Prior  to Admission medications   Medication Sig Start Date End Date Taking? Authorizing Provider  amLODipine (NORVASC) 5 MG tablet Take 0.5 tablets (2.5 mg total) by mouth daily. 09/08/15   Wendie Agreste, MD  Calcium Carbonate-Vit D-Min (CALTRATE PLUS PO) Take by mouth.      Historical Provider, MD  clobetasol cream (TEMOVATE) [id number] % Apply 1 application topically 2 (two) times daily as needed. 08/31/14   Wendie Agreste, MD  estradiol (ESTRACE) 0.5 MG tablet Take 0.5 mg by mouth daily.    Historical Provider, MD  Fish Oil OIL       Historical Provider, MD  FLUoxetine (PROZAC) 20 MG tablet Take 1 tablet (20 mg total) by mouth daily. 09/08/15   Wendie Agreste, MD  GLUCOSAMINE PO Take 1,200 mg by mouth daily.    Historical Provider, MD  hydrochlorothiazide (MICROZIDE) 12.5 MG capsule Take 1 capsule (12.5 mg total) by mouth every morning. 09/08/15   Wendie Agreste, MD  OVER THE COUNTER MEDICATION OTC Niacin taking    Historical Provider, MD  Progesterone Micronized (PROGESTERONE PO) Take by mouth.    Historical Provider, MD   Social History   Social History  . Marital Status: Married    Spouse Name: N/A  . Number of Children: N/A  . Years of Education: N/A   Occupational History  . Works for [employer]   Social History Main Topics  . Smoking status: Current Every Day Smoker -- 0.50 packs/day  . Smokeless tobacco: Not on file  . Alcohol Use: 1.2 oz/week    2 Standard drinks or equivalent per week  . Drug Use: No  . Sexual Activity: Yes   Other Topics Concern  . Not on file   Social History Narrative   Married. Exercises 5 days per week...speed walking, 3-4 miles     Review of Systems  Constitutional: Negative for fever.  Gastrointestinal: Negative for abdominal pain.  Genitourinary: Negative for urgency, hematuria and enuresis.  Neurological: Positive for numbness.       Objective:   Physical Exam  Constitutional: She is oriented to person, place, and time. She appears well-developed and well-nourished.  HENT:  Head: Normocephalic and atraumatic.  Eyes: Conjunctivae and EOM are normal. Pupils are equal, round, and reactive to light.  Neck: Carotid bruit is not present.  Cardiovascular: Normal rate, regular rhythm, normal heart sounds and intact distal pulses.   Pulses:      Dorsalis pedis pulses are 2+ on the right side, and 2+ on the left side.  Capillary refill < 1 second.  Pulmonary/Chest: Effort normal and breath sounds normal.  Abdominal: Soft. She exhibits  no pulsatile midline mass. There is no tenderness.  Musculoskeletal: She exhibits no edema.  Neurological: She is alert and oriented to person, place, and time.  Paresthesias or dysesthesias along the forefoot, plantar surface and toes.  Skin: Skin is warm and dry. No rash noted.  Toes warm.  Psychiatric: She has a normal mood and affect. Her behavior is normal.  Vitals reviewed.   Filed Vitals:   09/29/15 1631  BP: 130/80  Pulse: 88  Temp: 98.5 F (36.9 C)  TempSrc: Oral  Resp: 16  Height: 5\' 5"  (1.651 m)  Weight: 121 lb (54.885 kg)  SpO2: 98%         Assessment & Plan:   Kellie Mason is a 55 y.o. female Plantar neuropathy, unspecified laterality - Plan: Ambulatory referral to Neurology  -  hereditary neuropathy possible, but will have neuro eval. Recent tsh ok.   Hematuria - Plan: Ambulatory referral to Urology Tobacco abuse - Plan: Ambulatory referral to Urology Urinary dribbling - Plan: Ambulatory referral to Urology  -longstanding history of hematuria. Now with urinary dribbling, hx of tobacco abuse. Will refer again to urology for eval.   Essential hypertension - Plan: amLODipine (NORVASC) 2.5 MG tablet  - now improved in previous dose of amlodipine. New rx for 2.5mg  tabs - QD, but monitor BP and if over 140/90 - increase to 5mg  qd.   Adjustment disorder with mixed anxiety and depressed mood  -much improved with life coach/counseling, and confidence to confront her work stressor. Did not tolerate Prozac. Hold on further meds at this time. rtc precautions.   Meds ordered this encounter  Medications  . amLODipine (NORVASC) 2.5 MG tablet    Sig: Take 1 tablet (2.5 mg total) by mouth daily.    Dispense:  90 tablet    Refill:  1   Patient Instructions  Continue counseling with life coach. Ok to hold on new meds for this for now.   I will refer you to urology for urinary dribbling and blood in urine, and neurology for evaluation of numbness and tingling in feet.    Ok to return to 2.5mg  pill of amlodipine once per day. Keep a record of your blood pressures outside of the office and if over 140/90 - increase to 2 pills per day (5mg ).   Follow up in next 6 months - sooner if worse.     I personally performed the services described in this documentation, which was scribed in my presence. The recorded information has been reviewed and considered, and addended by me as needed.

## 2015-12-06 ENCOUNTER — Encounter: Payer: Self-pay | Admitting: Gastroenterology

## 2016-01-03 ENCOUNTER — Encounter: Payer: Self-pay | Admitting: Neurology

## 2016-01-03 ENCOUNTER — Ambulatory Visit (INDEPENDENT_AMBULATORY_CARE_PROVIDER_SITE_OTHER): Payer: Managed Care, Other (non HMO) | Admitting: Neurology

## 2016-01-03 VITALS — BP 146/84 | HR 78 | Resp 12 | Ht 65.0 in | Wt 120.8 lb

## 2016-01-03 DIAGNOSIS — G629 Polyneuropathy, unspecified: Secondary | ICD-10-CM

## 2016-01-03 MED ORDER — GABAPENTIN 300 MG PO CAPS: ORAL_CAPSULE | ORAL | Status: DC

## 2016-01-03 NOTE — Patient Instructions (Addendum)
Remember to drink plenty of fluid, eat healthy meals and do not skip any meals. Try to eat protein with a every meal and eat a healthy snack such as fruit or nuts in between meals. Try to keep a regular sleep-wake schedule and try to exercise daily, particularly in the form of walking, 20-30 minutes a day, if you can.   As far as your medications are concerned, I would like to suggest: Gabapentin(neurintin) 1-3 capsules before bed  As far as diagnostic testing: labs, emg/ncs  Our phone number is [phone]. We also have an after hours call service for urgent matters and there is a physician on-call for urgent questions. For any emergencies you know to call 911 or go to the nearest emergency room

## 2016-01-03 NOTE — Progress Notes (Signed)
[id number]    Provider:  Dr Jaynee Eagles Referring Provider: Wendie Agreste, MD Primary Care Physician:  Wendie Agreste, MD  CC:  Foot numbness  HPI:  Kellie Mason is a 55 y.o. female here as a referral from Dr. Carlota Raspberry for neuropathy. She has a PMHx of mixed depression and anxiety, HTN. She has numbness on the bottom of her feet. No inciting events. Symptoms are continuous. Nothing really makes it better or worse. It is in the arch and also on the toes. Feels like she is walking on cotton balls. She is having cramps in her toes,worse when she is idyl, better when walking. Numbness continuous. Cotton balls between toes. Started a few years ago, no inciting events except maybe a fall a few years ago. Some tingling, no burning. Her mom and oldest brother also have pain in the feet. She is very active. No Hx of drugs that can cause neuropathy such as long-term antibiotics, chemotherapy drugs, cardiovascular, rheumatologic, no exposure to toxins. No significant alcohol use, one glass of wine a night, she smokes 1/2 pack a day and is trying to quit. No rashes. She has psoriasis. She used to roll her ankles a lot as a child. She has fallen a lot. She has very thin ankles. No weakness. Ankles give out a lot. Mom would fall all the time as well. No back pain or radiculopathy. Sensory loss is symmetric. No other focal neurologic deficits or complaints.  Reviewed notes, labs and imaging from outside physicians, which showed:  DG Left Foot: Personally reviewed images and agree with following Findings: Three views of the left foot submitted. There is a vague lucent line medial aspect of the navicular suspicious for nondisplaced fracture. Clinical correlation is necessary.  IMPRESSION: Vague lucent line medial aspect of the navicular suspicious for nondisplaced fracture. Clinical correlation is necessary.  TSH and CMP normal 2.38 and February 2017, LDL 127   Review of  Systems: Patient complains of symptoms per HPI as well as the following symptoms; no chest pain, no shortness of breath, no fever, no chills. Pertinent negatives per HPI. All others negative.   Social History   Social History  . Marital Status: Married    Spouse Name: N/A  . Number of Children: N/A  . Years of Education: N/A   Occupational History  . Works for [employer]   Social History Main Topics  . Smoking status: Current Every Day Smoker -- 0.50 packs/day  . Smokeless tobacco: Not on file  . Alcohol Use: 1.2 oz/week    2 Standard drinks or equivalent per week  . Drug Use: No  . Sexual Activity: Yes   Other Topics Concern  . Not on file   Social History Narrative   Married. Exercises 5 days per week...speed walking, 3-4 miles    Family History  Problem Relation Age of Onset  . Hypertension Mother   . Cancer Mother   . Neuropathy Mother   . Hypertension Sister   . Cancer Father   . Hypertension Maternal Grandfather   . Neuropathy Brother     Past Medical History  Diagnosis Date  . Hypertension   . Dizziness - light-headed   . Chest pain   . Palpitations   . Tobacco abuse     No ready to quit  . Cough   . Basal cell carcinoma of anterior chest     shave bx - 08/12/12 - Dr. Wilhemina Bonito.  Marland Kitchen  Arthritis   . Headache     Past Surgical History  Procedure Laterality Date  . Electrocardiogram    . Knee arthroscopy Left     Current Outpatient Prescriptions  Medication Sig Dispense Refill  . amLODipine (NORVASC) 2.5 MG tablet Take 1 tablet (2.5 mg total) by mouth daily. 90 tablet 1  . Calcium Carbonate-Vit D-Min (CALTRATE PLUS PO) Take by mouth.      . clobetasol cream (TEMOVATE) [id number] % Apply 1 application topically 2 (two) times daily as needed. 60 g 1  . estradiol (ESTRACE) 0.5 MG tablet Take 0.5 mg by mouth daily.    . Fish Oil OIL      . GLUCOSAMINE PO Take 1,200 mg by mouth daily.    . hydrochlorothiazide (MICROZIDE) 12.5 MG  capsule Take 1 capsule (12.5 mg total) by mouth every morning. 90 capsule 1  . OVER THE COUNTER MEDICATION Reported on 09/29/2015    . Progesterone Micronized (PROGESTERONE PO) Take by mouth.    . gabapentin (NEURONTIN) 300 MG capsule In the evenings can 1-3 tablets before bed. 90 capsule 11   No current facility-administered medications for this visit.    Allergies as of 01/03/2016 - Review Complete 01/03/2016  Allergen Reaction Noted  . Cannabinoids  12/23/2010  . Tetracyclines & related  12/23/2010  . Codeine Rash 09/27/2011    Vitals: BP 146/84 mmHg  Pulse 78  Resp 12  Ht 5\' 5"  (1.651 m)  Wt 120 lb 12.8 oz (54.795 kg)  BMI 20.10 kg/m2 Last Weight:  Wt Readings from Last 1 Encounters:  01/03/16 120 lb 12.8 oz (54.795 kg)   Last Height:   Ht Readings from Last 1 Encounters:  01/03/16 5\' 5"  (1.651 m)   Physical exam: Exam: Gen: NAD, conversant, well nourised, well groomed                     CV: RRR, no MRG. No Carotid Bruits. No peripheral edema, warm, nontender Eyes: Conjunctivae clear without exudates or hemorrhage  Neuro: Detailed Neurologic Exam  Speech:    Speech is normal; fluent and spontaneous with normal comprehension.  Cognition:    The patient is oriented to person, place, and time;     recent and remote memory intact;     language fluent;     normal attention, concentration,     fund of knowledge Cranial Nerves:    The pupils are equal, round, and reactive to light. The fundi are normal and spontaneous venous pulsations are present. Visual fields are full to finger confrontation. Extraocular movements are intact. Trigeminal sensation is intact and the muscles of mastication are normal. The face is symmetric. The palate elevates in the midline. Hearing intact. Voice is normal. Shoulder shrug is normal. The tongue has normal motion without fasciculations.   Coordination:    Normal finger to nose and heel to shin. Normal rapid alternating movements.    Gait:    Heel-toe and tandem gait are normal.   Motor Observation:    No asymmetry, no atrophy, and no involuntary movements noted. Tone:    Normal muscle tone.    Posture:    Posture is normal. normal erect    Strength:    Strength is V/V in the upper and lower limbs.      Sensation: intact to LT, decreased pin prick and temp to the ankles, intact proprioception and vibration.      Reflex Exam:  DTR's:    Deep tendon reflexes in  the upper and lower extremities are brisk bilaterally.   Toes:    The toes are downgoing bilaterally.   Clonus:    Clonus is absent.       Assessment/Plan:  This is a very nice 55 year old female with numbness on the bottom of her feet for several years. Sensory exam is intact distally. She does have a history of thin ankles, rolling of the ankles and falls, with a possible family history as her brother and mother also had problem with the feet. We'll perform a complete serum neuropathy screen and an EMG nerve conduction study.  Emg/ncs - bilateral lower extremities.  As far as your medications are concerned, I would like to suggest: Gabapentin(neurontin) 1-3 capsules before bed. Common side effects include dizziness, drowsiness, weakness, tired feeling, nausea, diarrhea, constipation, blurred vision, headache, breast swelling, dry mouth, loss of balance or coordination. Stop for anything concerning.  As far as diagnostic testing: labs, emg/ncs   Sarina Ill, MD  [facility] [address] [place], [place] [address]  Phone [phone] Fax [phone]

## 2016-01-04 ENCOUNTER — Encounter: Payer: Self-pay | Admitting: Neurology

## 2016-01-04 DIAGNOSIS — G629 Polyneuropathy, unspecified: Secondary | ICD-10-CM | POA: Insufficient documentation

## 2016-01-05 ENCOUNTER — Telehealth: Payer: Self-pay | Admitting: *Deleted

## 2016-01-05 NOTE — Telephone Encounter (Signed)
LVM for pt to call about results. Gave [facility] phone number.  

## 2016-01-05 NOTE — Telephone Encounter (Signed)
-----   Message from Melvenia Beam, MD sent at 01/05/2016  4:52 PM EDT ----- Patient's B12 is very low, significantly low. This is significant. She should come in for B12 shots. 1069mcg b12 shots weekly for 4 weeks and then once a month for 6 months. recheck B12 in 12 weeks. This can be causing her neuropathy in the feet.  B12 deficiency can cause weakness, fatigue, easy bruising or bleeding,sore tongue, stomach upset, weight loss, and diarrhea or constipation, tingling or numbness to the fingers and toes, difficulty walking, mood changes, depression, memory loss, disorientation and, in severe cases, dementia.  One other lab came back positive, ANA but the corresponding antibody was just barely elevated. This appears to be a false positive. At next appointment we can do an ANA titer but I am not concerned, this happens often. i am however concerned about her b12. If she does not have an appointment yet for follow up with me then have her start B12 shots and follow up with me in 12 weeks. thanks

## 2016-01-06 ENCOUNTER — Other Ambulatory Visit: Payer: Self-pay | Admitting: *Deleted

## 2016-01-06 ENCOUNTER — Ambulatory Visit (INDEPENDENT_AMBULATORY_CARE_PROVIDER_SITE_OTHER): Payer: Managed Care, Other (non HMO) | Admitting: *Deleted

## 2016-01-06 DIAGNOSIS — E538 Deficiency of other specified B group vitamins: Secondary | ICD-10-CM | POA: Diagnosis not present

## 2016-01-06 MED ORDER — CYANOCOBALAMIN 1000 MCG/ML IJ SOLN
1000.0000 ug | INTRAMUSCULAR | Status: AC
  Administered 2016-01-06 – 2016-01-27 (×3): 1000 ug via INTRAMUSCULAR

## 2016-01-06 MED ORDER — CYANOCOBALAMIN 1000 MCG/ML IJ SOLN: 1000.0000 ug | INTRAMUSCULAR | Status: DC

## 2016-01-06 NOTE — Telephone Encounter (Signed)
Spoke to Kellie Mason about lab results per Dr Jaynee Eagles. Kellie Mason verbalized understanding. Scheduled first B12 injection on 01/10/16 at 1pm.

## 2016-01-06 NOTE — Progress Notes (Signed)
Gave Vitamin B-12 106mcg/1ml IM in leftdeltoid. Cleaned with alcohol wipe prior to injection. Band-aid applied. Pt tolerated well.

## 2016-01-06 NOTE — Telephone Encounter (Signed)
Pt returned Emma's call °

## 2016-01-07 LAB — COMPREHENSIVE METABOLIC PANEL
A/G RATIO: 2.1 (ref 1.2–2.2)
ALT: 15 IU/L (ref 0–32)
AST: 14 IU/L (ref 0–40)
Albumin: 4.8 g/dL (ref 3.5–5.5)
Alkaline Phosphatase: 67 IU/L (ref 39–117)
BILIRUBIN TOTAL: 0.5 mg/dL (ref 0.0–1.2)
BUN/Creatinine Ratio: 22 (ref 9–23)
BUN: 13 mg/dL (ref 6–24)
CHLORIDE: 97 mmol/L (ref 96–106)
CO2: 26 mmol/L (ref 18–29)
Calcium: 9.9 mg/dL (ref 8.7–10.2)
Creatinine, Ser: 0.6 mg/dL (ref 0.57–1.00)
GFR calc non Af Amer: 104 mL/min/{1.73_m2} (ref >59)
GFR, EST AFRICAN AMERICAN: 120 mL/min/{1.73_m2} (ref >59)
Globulin, Total: 2.3 g/dL (ref 1.5–4.5)
Glucose: 95 mg/dL (ref 65–99)
POTASSIUM: 4.7 mmol/L (ref 3.5–5.2)
SODIUM: 140 mmol/L (ref 134–144)
Total Protein: 7.1 g/dL (ref 6.0–8.5)

## 2016-01-07 LAB — MULTIPLE MYELOMA PANEL, SERUM
ALBUMIN/GLOB SERPL: 1.5 (ref 0.7–1.7)
Albumin SerPl Elph-Mcnc: 4.2 g/dL (ref 2.9–4.4)
Alpha 1: 0.3 g/dL (ref 0.0–0.4)
Alpha2 Glob SerPl Elph-Mcnc: 0.8 g/dL (ref 0.4–1.0)
B-Globulin SerPl Elph-Mcnc: 1.1 g/dL (ref 0.7–1.3)
GLOBULIN, TOTAL: 2.9 g/dL (ref 2.2–3.9)
Gamma Glob SerPl Elph-Mcnc: 0.6 g/dL (ref 0.4–1.8)
IGA/IMMUNOGLOBULIN A, SERUM: 114 mg/dL (ref 87–352)
IgG (Immunoglobin G), Serum: 604 mg/dL — ABNORMAL LOW (ref 700–1600)
IgM (Immunoglobulin M), Srm: 75 mg/dL (ref 26–217)

## 2016-01-07 LAB — RHEUMATOID FACTOR

## 2016-01-07 LAB — HEMOGLOBIN A1C
ESTIMATED AVERAGE GLUCOSE: 105 mg/dL
HEMOGLOBIN A1C: 5.3 % (ref 4.8–5.6)

## 2016-01-07 LAB — CBC
HEMOGLOBIN: 13.7 g/dL (ref 11.1–15.9)
Hematocrit: 40 % (ref 34.0–46.6)
MCH: 31.8 pg (ref 26.6–33.0)
MCHC: 34.3 g/dL (ref 31.5–35.7)
MCV: 93 fL (ref 79–97)
PLATELETS: 405 10*3/uL — AB (ref 150–379)
RBC: 4.31 x10E6/uL (ref 3.77–5.28)
RDW: 14.3 % (ref 12.3–15.4)
WBC: 10 10*3/uL (ref 3.4–10.8)

## 2016-01-07 LAB — B12 AND FOLATE PANEL
FOLATE: 9.2 ng/mL (ref >3.0)
VITAMIN B 12: 188 pg/mL — AB (ref 211–946)

## 2016-01-07 LAB — HEAVY METALS, BLOOD
Arsenic: 7 ug/L (ref 2–23)
Lead, Blood: 2 ug/dL (ref 0–19)
Mercury: NOT DETECTED ug/L (ref 0.0–14.9)

## 2016-01-07 LAB — ENA+DNA/DS+SJORGEN'S
ENA RNP Ab: 1 AI — ABNORMAL HIGH (ref 0.0–0.9)
ENA SM Ab Ser-aCnc: <0.2 AI (ref 0.0–0.9)

## 2016-01-07 LAB — HIV ANTIBODY (ROUTINE TESTING W REFLEX): HIV Screen 4th Generation wRfx: NONREACTIVE

## 2016-01-07 LAB — TSH: TSH: 4.25 u[IU]/mL (ref 0.450–4.500)

## 2016-01-07 LAB — RPR: RPR: NONREACTIVE

## 2016-01-07 LAB — VITAMIN B6: Vitamin B6: 17.7 ug/L (ref 2.0–32.8)

## 2016-01-07 LAB — VITAMIN B1: Thiamine: 122.4 nmol/L (ref 66.5–200.0)

## 2016-01-07 LAB — SJOGREN'S SYNDROME ANTIBODS(SSA + SSB): ENA SSA (RO) Ab: <0.2 AI (ref 0.0–0.9)

## 2016-01-07 LAB — SEDIMENTATION RATE: Sed Rate: 2 mm/hr (ref 0–40)

## 2016-01-07 LAB — METHYLMALONIC ACID, SERUM: METHYLMALONIC ACID: 113 nmol/L (ref 0–378)

## 2016-01-07 LAB — ANA W/REFLEX: Anti Nuclear Antibody(ANA): POSITIVE — AB

## 2016-01-07 LAB — HEPATITIS C ANTIBODY: Hep C Virus Ab: <0.1 s/co ratio (ref 0.0–0.9)

## 2016-01-10 ENCOUNTER — Ambulatory Visit: Payer: Self-pay

## 2016-01-13 ENCOUNTER — Ambulatory Visit (INDEPENDENT_AMBULATORY_CARE_PROVIDER_SITE_OTHER): Payer: Managed Care, Other (non HMO) | Admitting: *Deleted

## 2016-01-13 DIAGNOSIS — E538 Deficiency of other specified B group vitamins: Secondary | ICD-10-CM

## 2016-01-13 NOTE — Progress Notes (Signed)
GaveVitamin B12 1000mcgIM in right deltoid. Cleaned with alcohol wipe prior to injection. Band-aid applied. Pt tolerated well.  

## 2016-01-20 ENCOUNTER — Telehealth: Payer: Self-pay | Admitting: *Deleted

## 2016-01-20 ENCOUNTER — Ambulatory Visit: Payer: Managed Care, Other (non HMO) | Admitting: *Deleted

## 2016-01-20 NOTE — Telephone Encounter (Signed)
Ok per Dr Jaynee Eagles for pt to cx injection today and have an injection on 7/6 and 7/13 for her last weekly injection of Vitamin B12. She will then schedule for monthly injections for 6 months. I r/s pt as requested.

## 2016-01-20 NOTE — Telephone Encounter (Signed)
Dr Jaynee Eagles- please advise. What would you like pt to do?   Pt called and spoke to [employer]. In phone staff. Per Enid Derry: "I was going to tell you that your 4:30 B-12 inj. wants to r/s for 7-13 @ 4:40-[facility] MRN [id number]. I tried to put on your schedule but could not pull it up--need help scheduling". Pt was no longer on phone for me to discuss with her. I tried calling pt back, had to LVM for her to call.  Pt was instructed by Dr Jaynee Eagles to do 1 injection each week for 4 weeks and then she can go to 1 injection each month for 6 months. I will have to check with Dr Jaynee Eagles to see if she is ok to r/s to 7/13. She would miss this week and next weeks dose for her weekly injections.

## 2016-01-20 NOTE — Telephone Encounter (Signed)
Tried calling pt work number. Spoke to [employer]. She advised that pt went to lunch. I gave her [place] phone number for pt to call back.

## 2016-01-27 ENCOUNTER — Ambulatory Visit (INDEPENDENT_AMBULATORY_CARE_PROVIDER_SITE_OTHER): Payer: Managed Care, Other (non HMO) | Admitting: *Deleted

## 2016-01-27 DIAGNOSIS — E538 Deficiency of other specified B group vitamins: Secondary | ICD-10-CM | POA: Diagnosis not present

## 2016-01-27 DIAGNOSIS — E539 Vitamin B deficiency, unspecified: Secondary | ICD-10-CM

## 2016-01-27 NOTE — Progress Notes (Signed)
Gave Toradol 60mg/2ml IM in left deltoid. Cleaned with alcohol wipe prior to injection. Band-aid applied. Pt tolerated well.   

## 2016-02-03 ENCOUNTER — Ambulatory Visit (INDEPENDENT_AMBULATORY_CARE_PROVIDER_SITE_OTHER): Payer: Managed Care, Other (non HMO) | Admitting: *Deleted

## 2016-02-03 ENCOUNTER — Ambulatory Visit: Payer: Managed Care, Other (non HMO) | Admitting: *Deleted

## 2016-02-03 DIAGNOSIS — E538 Deficiency of other specified B group vitamins: Secondary | ICD-10-CM | POA: Diagnosis not present

## 2016-02-03 MED ORDER — CYANOCOBALAMIN 1000 MCG/ML IJ SOLN
1000.0000 ug | Freq: Once | INTRAMUSCULAR | Status: AC
  Administered 2016-02-03: 1000 ug via INTRAMUSCULAR

## 2016-02-03 NOTE — Progress Notes (Signed)
Patient in office for vit B 12 injection. Identified by full name and DOB. Vit B12 injection given IM, right deltoid. Pt tolerated well.

## 2016-02-08 ENCOUNTER — Ambulatory Visit: Payer: No Typology Code available for payment source | Admitting: Gastroenterology

## 2016-02-22 ENCOUNTER — Ambulatory Visit (INDEPENDENT_AMBULATORY_CARE_PROVIDER_SITE_OTHER): Payer: Self-pay | Admitting: Neurology

## 2016-02-22 ENCOUNTER — Ambulatory Visit (INDEPENDENT_AMBULATORY_CARE_PROVIDER_SITE_OTHER): Payer: Managed Care, Other (non HMO) | Admitting: Neurology

## 2016-02-22 DIAGNOSIS — G629 Polyneuropathy, unspecified: Secondary | ICD-10-CM

## 2016-02-22 DIAGNOSIS — Z0289 Encounter for other administrative examinations: Secondary | ICD-10-CM

## 2016-02-22 NOTE — Progress Notes (Signed)
  [id number]    Provider:  Dr Jaynee Eagles Referring Provider: Wendie Agreste, MD Primary Care Physician:  Wendie Agreste, MD  History:  Kellie Mason is a 55 y.o. female here as a referral from Dr. Carlota Raspberry for neuropathy. She has a PMHx of mixed depression and anxiety, HTN. She has numbness on the bottom of her feet. No inciting events. Symptoms are continuous. Nothing really makes it better or worse. It is in the arch and also on the toes. Feels like she is walking on cotton balls. She is having cramps in her toes,worse when she is idyl, better when walking. Numbness continuous. Cotton balls between toes. Started a few years ago, no inciting events except maybe a fall a few years ago. Work up revealed significant B12 neuropathy. Likely small-fiber neuropathy due to B12 deficiency.     Summary  Nerve conduction studies were performed on the bilateral lower extremities:  The bilateral Peroneal motor nerves showed normal conductions with normal F Wave latencies The bilateral Tibial motor nerves showed normal conductions with normal F Wave latencies The bilateral Superficial Peroneal sensory nerve conductions were within normal limits Bilateral H Reflexes showed normal latencies  EMG Needle study was performed on selected right lower extremity muscles:   The  Vastus Medialis, Anterior Tibialis, Medial Gastrocnemius, Extensor Hallucis Longus and Abductor Hallucis muscles were within normal limits.  Conclusion: This is a normal study. However a small fiber neuropathy could be responsible for patient's symptoms and still evade detection by this study.   Sarina Ill, MD  [facility] [address] [place], [place] [address]  Phone [phone] Fax [phone]

## 2016-02-23 NOTE — Procedures (Signed)
[id number] [facility]    Provider:  Dr Jaynee Eagles Referring Provider: Wendie Agreste, MD Primary Care Physician:  Wendie Agreste, MD  History:  Kellie Mason is a 55 y.o. female here as a referral from Dr. Carlota Raspberry for neuropathy. She has a PMHx of mixed depression and anxiety, HTN. She has numbness on the bottom of her feet. No inciting events. Symptoms are continuous. Nothing really makes it better or worse. It is in the arch and also on the toes. Feels like she is walking on cotton balls. She is having cramps in her toes,worse when she is idyl, better when walking. Numbness continuous. Cotton balls between toes. Started a few years ago, no inciting events except maybe a fall a few years ago. Work up revealed significant B12 neuropathy. Likely small-fiber neuropathy due to B12 deficiency.     Summary  Nerve conduction studies were performed on the bilateral lower extremities:  The bilateral Peroneal motor nerves showed normal conductions with normal F Wave latencies The bilateral Tibial motor nerves showed normal conductions with normal F Wave latencies The bilateral Superficial Peroneal sensory nerve conductions were within normal limits Bilateral H Reflexes showed normal latencies  EMG Needle study was performed on selected right lower extremity muscles:   The  Vastus Medialis, Anterior Tibialis, Medial Gastrocnemius, Extensor Hallucis Longus and Abductor Hallucis muscles were within normal limits.  Conclusion: This is a normal study. However a small fiber neuropathy could be responsible for patient's symptoms and still evade detection by this study.   Sarina Ill, MD  [facility] [address] [place], [place] [address]  Phone [phone] Fax [phone]

## 2016-02-23 NOTE — Progress Notes (Signed)
See procedure note.

## 2016-03-08 ENCOUNTER — Ambulatory Visit (INDEPENDENT_AMBULATORY_CARE_PROVIDER_SITE_OTHER): Payer: Managed Care, Other (non HMO) | Admitting: *Deleted

## 2016-03-08 DIAGNOSIS — E538 Deficiency of other specified B group vitamins: Secondary | ICD-10-CM | POA: Diagnosis not present

## 2016-03-08 MED ORDER — CYANOCOBALAMIN 1000 MCG/ML IJ SOLN
1000.0000 ug | Freq: Once | INTRAMUSCULAR | Status: AC
  Administered 2016-03-08: 1000 ug via INTRAMUSCULAR

## 2016-03-08 NOTE — Progress Notes (Signed)
Pt here for B 12 injection.  Under aseptic technique cyanocobalamin 1000mcg/1ml IM given L deltoid.  Tolerated well.  Bandaid applied.  

## 2016-04-05 ENCOUNTER — Ambulatory Visit: Payer: 59 | Admitting: Family Medicine

## 2016-04-06 ENCOUNTER — Ambulatory Visit (INDEPENDENT_AMBULATORY_CARE_PROVIDER_SITE_OTHER): Payer: Managed Care, Other (non HMO) | Admitting: *Deleted

## 2016-04-06 ENCOUNTER — Ambulatory Visit: Payer: 59 | Admitting: Family Medicine

## 2016-04-06 ENCOUNTER — Telehealth: Payer: Self-pay | Admitting: Neurology

## 2016-04-06 DIAGNOSIS — E538 Deficiency of other specified B group vitamins: Secondary | ICD-10-CM | POA: Diagnosis not present

## 2016-04-06 MED ORDER — CYANOCOBALAMIN 1000 MCG/ML IJ SOLN
1000.0000 ug | Freq: Once | INTRAMUSCULAR | Status: AC
  Administered 2016-04-06: 1000 ug via INTRAMUSCULAR

## 2016-04-06 NOTE — Telephone Encounter (Signed)
Hey Can you please contact Kellie Mason on her Botox I just want to make sure it is scheduled correctly. Thank you The best number to contact is [phone]

## 2016-04-06 NOTE — Progress Notes (Signed)
Gave 1050mcg Vitamin B12 IM to right deltoid. Cleaned with alcohol wipe prior to injection. Applied band-aid. Pt tolerated well.

## 2016-04-06 NOTE — Telephone Encounter (Signed)
Patient is not on Botox please disregard this message this was an error

## 2016-04-14 ENCOUNTER — Ambulatory Visit (INDEPENDENT_AMBULATORY_CARE_PROVIDER_SITE_OTHER): Payer: Managed Care, Other (non HMO) | Admitting: Family Medicine

## 2016-04-14 VITALS — BP 120/82 | HR 71 | Temp 98.3°F | Resp 18 | Ht 65.0 in | Wt 116.4 lb

## 2016-04-14 DIAGNOSIS — I10 Essential (primary) hypertension: Secondary | ICD-10-CM | POA: Diagnosis not present

## 2016-04-14 DIAGNOSIS — H539 Unspecified visual disturbance: Secondary | ICD-10-CM

## 2016-04-14 MED ORDER — HYDROCHLOROTHIAZIDE 12.5 MG PO CAPS: 12.5000 mg | ORAL_CAPSULE | ORAL | 1 refills | Status: DC

## 2016-04-14 MED ORDER — AMLODIPINE BESYLATE 2.5 MG PO TABS: 2.5000 mg | ORAL_TABLET | Freq: Every day | ORAL | 1 refills | Status: DC

## 2016-04-14 NOTE — Progress Notes (Signed)
By signing my name below, I, Mesha Guinyard, attest that this documentation has been prepared under the direction and in the presence of [name], MD.  Electronically Signed: Verlee Monte, Medical Scribe. 04/14/16. 5:19 PM.  Subjective:    Patient ID: Kellie Mason, female    DOB: 1961/06/13, 55 y.o.   MRN: [id number]  HPI Chief Complaint  Patient presents with  . Follow-up    6 month follow up    HPI Comments: Kellie Mason is a 55 y.o. female who presents to the [facility] for HTN follow-up.  HTN: Pt is compliant with Norvasc 2.5 mg QD, and HCTZ 12.5 mg QD. Pt denies experiencing any side effects while on this medication such as CP, dyspnea, SOB, light-headedness and other symptoms. Lab Results  Component Value Date   CREATININE 0.60 01/03/2016   Tobacco Abuse:  Pt smokes .5 ppd  Vit B12 Deficiency: She has been under the care of neurology for neuropathy. Found to have a Vit B12 deficiency. She is recieving B12 injections through neurology.  Eye: Pt states Monday morning (4 days ago), for the 4th time in the past couple of months, her eyes "went cross" and she could only see a line in front of her with peripheral vision loss. Pt has experienced an episode in July or July. Pt states it felt like her eye drifted towards her nose. Pt states there was another time when it occurred while she was watching TV and it was the worse episode. Pt is followed by an ophthalmologist and had lasix surgery in the past. FHx: sister has brain CA, she passed out at work and it was too big to cut. Pt denies slurred speech, facial droop, and arm weakness.  Patient Active Problem List   Diagnosis Date Noted  . Neuropathy ([place]) 01/04/2016  . History of basal cell carcinoma 11/18/2012  . Atopic dermatitis 04/12/2012  . Hypertension 02/06/2011  . Tobacco use disorder 02/06/2011  . Healthcare maintenance 02/06/2011   Past Medical History:  Diagnosis Date  . Arthritis     . Basal cell carcinoma of anterior chest    shave bx - 08/12/12 - Dr. Wilhemina Bonito.  . Chest pain   . Cough   . Dizziness - light-headed   . Headache   . Hypertension   . Palpitations   . Tobacco abuse    No ready to quit   Past Surgical History:  Procedure Laterality Date  . ELECTROCARDIOGRAM    . KNEE ARTHROSCOPY Left    Allergies  Allergen Reactions  . Cannabinoids   . Tetracyclines & Related   . Codeine Rash   Prior to Admission medications   Medication Sig Start Date End Date Taking? Authorizing Provider  amLODipine (NORVASC) 2.5 MG tablet Take 1 tablet (2.5 mg total) by mouth daily. 09/29/15  Yes Wendie Agreste, MD  Calcium Carbonate-Vit D-Min (CALTRATE PLUS PO) Take by mouth.     Yes Historical Provider, MD  clobetasol cream (TEMOVATE) [id number] % Apply 1 application topically 2 (two) times daily as needed. 08/31/14  Yes Wendie Agreste, MD  estradiol (ESTRACE) 0.5 MG tablet Take 0.5 mg by mouth daily.   Yes Historical Provider, MD  Fish Oil OIL     Yes Historical Provider, MD  gabapentin (NEURONTIN) 300 MG capsule In the evenings can 1-3 tablets before bed. 01/03/16  Yes Melvenia Beam, MD  GLUCOSAMINE PO Take 1,200 mg by mouth daily.   Yes Historical Provider,  MD  hydrochlorothiazide (MICROZIDE) 12.5 MG capsule Take 1 capsule (12.5 mg total) by mouth every morning. 09/08/15  Yes Wendie Agreste, MD  OVER THE COUNTER MEDICATION Reported on 09/29/2015   Yes Historical Provider, MD  Progesterone Micronized (PROGESTERONE PO) Take by mouth.   Yes Historical Provider, MD   Social History   Social History  . Marital status: Married    Spouse name: N/A  . Number of children: N/A  . Years of education: N/A   Occupational History  . Works for Tree surgeon   Social History Main Topics  . Smoking status: Current Every Day Smoker    Packs/day: 0.50  . Smokeless tobacco: Not on file  . Alcohol use 1.2 oz/week    2 Standard drinks or equivalent per week   . Drug use: No  . Sexual activity: Yes   Other Topics Concern  . Not on file   Social History Narrative   Married. Exercises 5 days per week...speed walking, 3-4 miles   Depression screen [facility] 2/9 04/14/2016 09/29/2015 09/08/2015 03/01/2015 08/31/2014  Decreased Interest 0 0 2 0 0  Down, Depressed, Hopeless 0 0 2 0 0  PHQ - 2 Score 0 0 4 0 0  Altered sleeping - 0 3 - -  Tired, decreased energy - 0 2 - -  Change in appetite - 0 3 - -  Feeling bad or failure about yourself  - 0 2 - -  Trouble concentrating - 0 2 - -  Moving slowly or fidgety/restless - 0 3 - -  Suicidal thoughts - 0 0 - -  PHQ-9 Score - 0 19 - -  Difficult doing work/chores - Not difficult at all Somewhat difficult - -   Review of Systems  Constitutional: Negative for fatigue and unexpected weight change.  Eyes: Positive for visual disturbance.  Respiratory: Negative for chest tightness and shortness of breath.   Cardiovascular: Negative for chest pain, palpitations and leg swelling.  Gastrointestinal: Negative for abdominal pain and blood in stool.  Neurological: Negative for dizziness, syncope, facial asymmetry, speech difficulty (slurred speech), weakness, light-headedness and headaches.   Objective:  Physical Exam  Constitutional: She is oriented to person, place, and time. She appears well-developed and well-nourished.  HENT:  Head: Normocephalic and atraumatic.  Eyes: Conjunctivae and EOM are normal. Pupils are equal, round, and reactive to light. Right eye exhibits no nystagmus. Left eye exhibits no nystagmus.  Neck: Carotid bruit is not present.  Cardiovascular: Normal rate, regular rhythm, normal heart sounds and intact distal pulses.  Exam reveals no friction rub.   No murmur heard. Pulmonary/Chest: Effort normal and breath sounds normal. No respiratory distress. She has no wheezes. She has no rales.  Abdominal: Soft. She exhibits no pulsatile midline mass. There is no tenderness.  Neurological: She is  alert and oriented to person, place, and time. She has normal strength. She displays a negative Romberg sign.  No pronator drift Equal grip strength Equal strength lower extremities No facial droop  Skin: Skin is warm and dry.  Psychiatric: She has a normal mood and affect. Her behavior is normal.  Vitals reviewed.  BP 120/82   Pulse 71   Temp 98.3 F (36.8 C) (Oral)   Resp 18   Ht 5\' 5"  (1.651 m)   Wt 116 lb 6.4 oz (52.8 kg)   LMP 07/24/2013   SpO2 100%   BMI 19.37 kg/m  Assessment & Plan:   Kellie Mason is a 54 y.o. female Visual disturbance  - Rare, intermittent symptoms for months. Transient visual field change, without persistent diplopia or visual field loss. Family history of brain tumor. She has a nonfocal neuro exam, no visual symptoms at present. Advised to have evaluation with her ophthalmologist first, then follow-up with me in the next few weeks to discuss the symptoms further as neurology eval or neuroimaging may be needed. RTC/ER precautions if recurrent  Essential hypertension - Plan: hydrochlorothiazide (MICROZIDE) 12.5 MG capsule, amLODipine (NORVASC) 2.5 MG tablet  - Overall stable. No change in medications for now.  Tobacco abuse, cessation discussed, RTC if assistance needed in cessation when she is ready.  Meds ordered this encounter  Medications  . hydrochlorothiazide (MICROZIDE) 12.5 MG capsule    Sig: Take 1 capsule (12.5 mg total) by mouth every morning.    Dispense:  90 capsule    Refill:  1  . amLODipine (NORVASC) 2.5 MG tablet    Sig: Take 1 tablet (2.5 mg total) by mouth daily.    Dispense:  90 tablet    Refill:  1   Patient Instructions    No change in blood pressure medication for now. Let me know when you're ready to quit smoking.  Follow-up with your eye care provider to discuss your eye symptoms, but I would like you to follow-up with me in the next few weeks to discuss the symptoms further and possibly discuss imaging or other  evaluation with your neurologist.  Return to the clinic or go to the nearest emergency room if any of your symptoms worsen or new symptoms occur.   IF you received an x-ray today, you will receive an invoice from [facility]. Please contact [facility]iology at [phone] with questions or concerns regarding your invoice.   IF you received labwork today, you will receive an invoice from [facility]. Please contact [facility] at [phone] with questions or concerns regarding your invoice.   Our billing staff will not be able to assist you with questions regarding bills from these companies.  You will be contacted with the lab results as soon as they are available. The fastest way to get your results is to activate your My Chart account. Instructions are located on the last page of this paperwork. If you have not heard from [place] regarding the results in 2 weeks, please contact this office.        I personally performed the services described in this documentation, which was scribed in my presence. The recorded information has been reviewed and considered, and addended by me as needed.   Signed,   [name], MD [facility].  04/16/16 1:35 PM

## 2016-04-14 NOTE — Patient Instructions (Addendum)
  No change in blood pressure medication for now. Let me know when you're ready to quit smoking.  Follow-up with your eye care provider to discuss your eye symptoms, but I would like you to follow-up with me in the next few weeks to discuss the symptoms further and possibly discuss imaging or other evaluation with your neurologist.  Return to the clinic or go to the nearest emergency room if any of your symptoms worsen or new symptoms occur.   IF you received an x-ray today, you will receive an invoice from [facility]. Please contact [facility]iology at [phone] with questions or concerns regarding your invoice.   IF you received labwork today, you will receive an invoice from [facility]. Please contact [facility] at [phone] with questions or concerns regarding your invoice.   Our billing staff will not be able to assist you with questions regarding bills from these companies.  You will be contacted with the lab results as soon as they are available. The fastest way to get your results is to activate your My Chart account. Instructions are located on the last page of this paperwork. If you have not heard from [place] regarding the results in 2 weeks, please contact this office.

## 2016-04-18 ENCOUNTER — Ambulatory Visit: Payer: Self-pay | Admitting: Gastroenterology

## 2016-04-19 ENCOUNTER — Telehealth: Payer: Self-pay

## 2016-04-19 DIAGNOSIS — I10 Essential (primary) hypertension: Secondary | ICD-10-CM

## 2016-04-19 NOTE — Telephone Encounter (Signed)
Pt states that she got a refill on hydrochlorothiazide and it needs to go to [facility] [address]   Best number to call [phone]

## 2016-04-24 MED ORDER — HYDROCHLOROTHIAZIDE 12.5 MG PO CAPS: 12.5000 mg | ORAL_CAPSULE | ORAL | 1 refills | Status: DC

## 2016-04-24 NOTE — Telephone Encounter (Signed)
Resent to [address] [facility]

## 2016-04-24 NOTE — Telephone Encounter (Signed)
Pt. Advised. 

## 2016-05-08 ENCOUNTER — Ambulatory Visit (INDEPENDENT_AMBULATORY_CARE_PROVIDER_SITE_OTHER): Payer: Managed Care, Other (non HMO) | Admitting: *Deleted

## 2016-05-08 DIAGNOSIS — E538 Deficiency of other specified B group vitamins: Secondary | ICD-10-CM | POA: Diagnosis not present

## 2016-05-08 MED ORDER — CYANOCOBALAMIN 1000 MCG/ML IJ SOLN
1000.0000 ug | INTRAMUSCULAR | Status: AC
  Administered 2016-05-08 – 2016-08-08 (×4): 1000 ug via INTRAMUSCULAR

## 2016-05-08 NOTE — Progress Notes (Signed)
Pt here for B 12 injection.  Under aseptic technique cyanocobalamin 1000mcg/1ml IM given L deltoid.  Tolerated well.  Bandaid applied.  

## 2016-06-05 ENCOUNTER — Ambulatory Visit (INDEPENDENT_AMBULATORY_CARE_PROVIDER_SITE_OTHER): Payer: Managed Care, Other (non HMO) | Admitting: *Deleted

## 2016-06-05 DIAGNOSIS — E538 Deficiency of other specified B group vitamins: Secondary | ICD-10-CM | POA: Diagnosis not present

## 2016-06-05 NOTE — Progress Notes (Signed)
GaveVitamin B12 1000mcgIM in right deltoid. Cleaned with alcohol wipe prior to injection. Band-aid applied. Pt tolerated well.  

## 2016-06-29 ENCOUNTER — Ambulatory Visit: Payer: Managed Care, Other (non HMO) | Admitting: Neurology

## 2016-07-03 ENCOUNTER — Ambulatory Visit (INDEPENDENT_AMBULATORY_CARE_PROVIDER_SITE_OTHER): Payer: Managed Care, Other (non HMO) | Admitting: *Deleted

## 2016-07-03 DIAGNOSIS — E538 Deficiency of other specified B group vitamins: Secondary | ICD-10-CM

## 2016-07-26 ENCOUNTER — Telehealth: Payer: Self-pay | Admitting: Neurology

## 2016-07-27 NOTE — Telephone Encounter (Signed)
error 

## 2016-08-03 ENCOUNTER — Ambulatory Visit: Payer: Self-pay

## 2016-08-08 ENCOUNTER — Encounter: Payer: Self-pay | Admitting: Neurology

## 2016-08-08 ENCOUNTER — Ambulatory Visit (INDEPENDENT_AMBULATORY_CARE_PROVIDER_SITE_OTHER): Payer: Managed Care, Other (non HMO) | Admitting: Neurology

## 2016-08-08 VITALS — BP 132/70 | HR 85 | Ht 65.0 in | Wt 117.2 lb

## 2016-08-08 DIAGNOSIS — E538 Deficiency of other specified B group vitamins: Secondary | ICD-10-CM | POA: Insufficient documentation

## 2016-08-08 DIAGNOSIS — R768 Other specified abnormal immunological findings in serum: Secondary | ICD-10-CM | POA: Diagnosis not present

## 2016-08-08 DIAGNOSIS — G63 Polyneuropathy in diseases classified elsewhere: Secondary | ICD-10-CM | POA: Diagnosis not present

## 2016-08-08 DIAGNOSIS — G629 Polyneuropathy, unspecified: Secondary | ICD-10-CM

## 2016-08-08 MED ORDER — GABAPENTIN 300 MG PO CAPS: ORAL_CAPSULE | ORAL | 11 refills | Status: DC

## 2016-08-08 NOTE — Patient Instructions (Signed)
Remember to drink plenty of fluid, eat healthy meals and do not skip any meals. Try to eat protein with a every meal and eat a healthy snack such as fruit or nuts in between meals. Try to keep a regular sleep-wake schedule and try to exercise daily, particularly in the form of walking, 20-30 minutes a day, if you can.   As far as your medications are concerned, I would like to suggest: Continue Gabapentin and B12 1046mcg daily  I would like to see you back in 6 months, sooner if we need to. Please call us with any interim questions, concerns, problems, updates or refill requests.   Our phone number is [phone]. We also have an after hours call service for urgent matters and there is a physician on-call for urgent questions. For any emergencies you know to call 911 or go to the nearest emergency room

## 2016-08-08 NOTE — Progress Notes (Signed)
[id number]    Provider:  Dr Jaynee Eagles Referring Provider: Wendie Agreste, MD Primary Care Physician:  Wendie Agreste, MD  CC:  Foot numbness, B12 deficiency  Interval History 08/08/2016: Kellie Mason is a 56 y.o. female here as a followup from Dr. Carlota Raspberry for neuropathy. Extensive serum lab testing was completed including TSH, HIV/sedimentation rate/ANA with reflex, B12 and folate, Sjogren's, RPR, hepatitis C, heavy metals, vitamin B6, multiple myeloma panel, B1, hemoglobin A1c, CMP, CBC, rheumatoid factor. Patient's B12 was significantly low and we started her on B12 shots. ANA was positive but just barely elevated, this is uncertain clinical significance and is likely a false positive. We can do an ANA titer today. I was more concerned about her B12 which was 188. EMG nerve conduction study was normal however this does not rule out small fiber neuropathy which can be caused by B12 deficiency. She was started on neuropathy for her pain.  Today she returns for follow up after receiving B12 shots. The symptoms are stable. She has cramping in the feet helped by gabapentin without side effects. Discussed B12 neuropathy, prognosis as well as B12 deficiency causes. I would like her to have her B12 tested next month. Also at next appoontment to ensure she can absorb oral b12.  HPI:  Kellie Mason is a 56 y.o. female here as a referral from Dr. Carlota Raspberry for neuropathy. She has a PMHx of mixed depression and anxiety, HTN. She has numbness on the bottom of her feet. No inciting events. Symptoms are continuous. Nothing really makes it better or worse. It is in the arch and also on the toes. Feels like she is walking on cotton balls. She is having cramps in her toes,worse when she is idyl, better when walking. Numbness continuous. Cotton balls between toes. Started a few years ago, no inciting events except maybe a fall a few years ago. Some tingling, no burning. Her mom and oldest brother  also have pain in the feet. She is very active. No Hx of drugs that can cause neuropathy such as long-term antibiotics, chemotherapy drugs, cardiovascular, rheumatologic, no exposure to toxins. No significant alcohol use, one glass of wine a night, she smokes 1/2 pack a day and is trying to quit. No rashes. She has psoriasis. She used to roll her ankles a lot as a child. She has fallen a lot. She has very thin ankles. No weakness. Ankles give out a lot. Mom would fall all the time as well. No back pain or radiculopathy. Sensory loss is symmetric. No other focal neurologic deficits or complaints.  Reviewed notes, labs and imaging from outside physicians, which showed:  DG Left Foot: Personally reviewed images and agree with following Findings: Three views of the left foot submitted. There is a vague lucent line medial aspect of the navicular suspicious for nondisplaced fracture. Clinical correlation is necessary.  IMPRESSION: Vague lucent line medial aspect of the navicular suspicious for nondisplaced fracture. Clinical correlation is necessary.  TSH and CMP normal 2.38 and February 2017, LDL 127   Review of Systems: Patient complains of symptoms per HPI as well as the following symptoms; no chest pain, no shortness of breath, no fever, no chills. Pertinent negatives per HPI. All others negative.   Social History   Social History  . Marital status: Married    Spouse name: N/A  . Number of children: N/A  . Years of education: N/A   Occupational History  . Works for Education officer, community  counter sales   Social History Main Topics  . Smoking status: Current Every Day Smoker    Packs/day: 0.50  . Smokeless tobacco: Not on file  . Alcohol use 1.2 oz/week    2 Standard drinks or equivalent per week  . Drug use: No  . Sexual activity: Yes   Other Topics Concern  . Not on file   Social History Narrative   Married. Exercises 5 days per week...speed walking, 3-4 miles     Family History  Problem Relation Age of Onset  . Hypertension Mother   . Cancer Mother   . Neuropathy Mother   . Hypertension Sister   . Cancer Father   . Hypertension Maternal Grandfather   . Neuropathy Brother     Past Medical History:  Diagnosis Date  . Arthritis   . Basal cell carcinoma of anterior chest    shave bx - 08/12/12 - Dr. Wilhemina Bonito.  . Chest pain   . Cough   . Dizziness - light-headed   . Headache   . Hypertension   . Palpitations   . Tobacco abuse    No ready to quit    Past Surgical History:  Procedure Laterality Date  . ELECTROCARDIOGRAM    . KNEE ARTHROSCOPY Left     Current Outpatient Prescriptions  Medication Sig Dispense Refill  . amLODipine (NORVASC) 2.5 MG tablet Take 1 tablet (2.5 mg total) by mouth daily. 90 tablet 1  . Calcium Carbonate-Vit D-Min (CALTRATE PLUS PO) Take by mouth.      . clobetasol cream (TEMOVATE) 9.47 % Apply 1 application topically 2 (two) times daily as needed. 60 g 1  . estradiol (ESTRACE) 0.5 MG tablet Take 0.5 mg by mouth daily.    . Fish Oil OIL      . gabapentin (NEURONTIN) 300 MG capsule In the evenings can 1-3 tablets before bed. 90 capsule 11  . GLUCOSAMINE PO Take 1,200 mg by mouth daily.    . hydrochlorothiazide (MICROZIDE) 12.5 MG capsule Take 1 capsule (12.5 mg total) by mouth every morning. 90 capsule 1  . OVER THE COUNTER MEDICATION Reported on 09/29/2015    . Progesterone Micronized (PROGESTERONE PO) Take by mouth.     Current Facility-Administered Medications  Medication Dose Route Frequency Provider Last Rate Last Dose  . cyanocobalamin ((VITAMIN B-12)) injection 1,000 mcg  1,000 mcg Intramuscular Q30 days Melvenia Beam, MD   1,000 mcg at 07/03/16 1620    Allergies as of 08/08/2016 - Review Complete 04/14/2016  Allergen Reaction Noted  . Cannabinoids  12/23/2010  . Tetracyclines & related  12/23/2010  . Codeine Rash 09/27/2011    Vitals: There were no vitals taken for this visit. Last  Weight:  Wt Readings from Last 1 Encounters:  04/14/16 116 lb 6.4 oz (52.8 kg)   Last Height:   Ht Readings from Last 1 Encounters:  04/14/16 5' 5"  (1.651 m)   Physical exam: Exam: Gen: NAD, conversant, well nourised, well groomed                     CV: RRR, no MRG. No Carotid Bruits. No peripheral edema, warm, nontender Eyes: Conjunctivae clear without exudates or hemorrhage  Neuro: Detailed Neurologic Exam  Speech:    Speech is normal; fluent and spontaneous with normal comprehension.  Cognition:    The patient is oriented to person, place, and time;     recent and remote memory intact;     language fluent;  normal attention, concentration,     fund of knowledge Cranial Nerves:    The pupils are equal, round, and reactive to light. The fundi are normal and spontaneous venous pulsations are present. Visual fields are full to finger confrontation. Extraocular movements are intact. Trigeminal sensation is intact and the muscles of mastication are normal. The face is symmetric. The palate elevates in the midline. Hearing intact. Voice is normal. Shoulder shrug is normal. The tongue has normal motion without fasciculations.   Coordination:    Normal finger to nose and heel to shin. Normal rapid alternating movements.   Gait:    Heel-toe and tandem gait are normal.   Motor Observation:    No asymmetry, no atrophy, and no involuntary movements noted. Tone:    Normal muscle tone.    Posture:    Posture is normal. normal erect    Strength:    Strength is V/V in the upper and lower limbs.      Sensation: intact to LT, decreased pin prick and temp to the ankles, intact proprioception and vibration.      Reflex Exam:  DTR's:    Deep tendon reflexes in the upper and lower extremities are brisk bilaterally.   Toes:    The toes are downgoing bilaterally.   Clonus:    Clonus is absent.     Assessment/Plan:  This is a very nice 56 year old female with numbness  on the bottom of her feet for several years. Sensory exam is intact distally. We performed a complete serum neuropathy screen and an EMG nerve conduction study.  - Extensive serum lab testing was completed including TSH, HIV/sedimentation rate/ANA with reflex, B12 and folate, Sjogren's, RPR, hepatitis C, heavy metals, vitamin B6, multiple myeloma panel, B1, hemoglobin A1c, CMP, CBC, rheumatoid factor. Patient's B12 was significantly low and we started her on B12 shots. ANA was positive but just barely elevated, this is uncertain clinical significance and is likely a false positive. We can do an ANA titer today. I was more concerned about her B12 which was 188. EMG nerve conduction study was normal however this does not rule out small fiber neuropathy which can be caused by B12 deficiency. Will check B12 in about a month and then at next appointment.  - Continue Gabapentin, it is helping the cramps in the feet.  - Will ask pcp to check b12 at her appointment next month if they don't mind   Sarina Ill, MD  [facility] [address] [place], [place] [address]  Phone [phone] Fax [phone] A total of 30 minutes was spent face-to-face with this patient. Over half this time was spent on counseling patient on the B12 deficiency neuropathy and +ANA diagnosis and different diagnostic and therapeutic options available.

## 2016-08-08 NOTE — Progress Notes (Signed)
Gave Vitamin B12 1000mcg in left deltoid. Cleaned with alcohol wipe prior to injection. Band-aid applied. Pt tolerated well.   

## 2016-08-09 LAB — ANA: ANA Titer 1: NEGATIVE

## 2016-08-11 ENCOUNTER — Telehealth: Payer: Self-pay | Admitting: *Deleted

## 2016-08-11 NOTE — Telephone Encounter (Signed)
Called and spoke to pt about normal labs per AA, MD note. Asked her to forward Vitamin B12 results from PCP once she has this completed. She verbalized understanding.

## 2016-08-11 NOTE — Telephone Encounter (Signed)
-----   Message from Melvenia Beam, MD sent at 08/10/2016  7:02 PM EST ----- Labs normal thanks

## 2016-08-29 ENCOUNTER — Other Ambulatory Visit: Payer: Self-pay | Admitting: Obstetrics and Gynecology

## 2016-08-29 DIAGNOSIS — N644 Mastodynia: Secondary | ICD-10-CM

## 2016-09-01 ENCOUNTER — Ambulatory Visit
Admission: RE | Admit: 2016-09-01 | Discharge: 2016-09-01 | Disposition: A | Discharge status: Home / Self Care | Payer: Managed Care, Other (non HMO) | Source: Ambulatory Visit | Attending: Obstetrics and Gynecology | Admitting: Obstetrics and Gynecology

## 2016-09-01 DIAGNOSIS — N644 Mastodynia: Secondary | ICD-10-CM

## 2016-10-12 ENCOUNTER — Encounter: Payer: Self-pay | Admitting: Family Medicine

## 2016-10-12 ENCOUNTER — Ambulatory Visit (INDEPENDENT_AMBULATORY_CARE_PROVIDER_SITE_OTHER): Payer: Managed Care, Other (non HMO) | Admitting: Family Medicine

## 2016-10-12 VITALS — BP 137/83 | HR 64 | Temp 98.7°F | Resp 16 | Ht 65.0 in | Wt 118.2 lb

## 2016-10-12 DIAGNOSIS — Z13220 Encounter for screening for lipoid disorders: Secondary | ICD-10-CM | POA: Diagnosis not present

## 2016-10-12 DIAGNOSIS — I10 Essential (primary) hypertension: Secondary | ICD-10-CM | POA: Diagnosis not present

## 2016-10-12 DIAGNOSIS — Z720 Tobacco use: Secondary | ICD-10-CM | POA: Diagnosis not present

## 2016-10-12 MED ORDER — AMLODIPINE BESYLATE 2.5 MG PO TABS: 2.5000 mg | ORAL_TABLET | Freq: Every day | ORAL | 2 refills | Status: DC

## 2016-10-12 MED ORDER — HYDROCHLOROTHIAZIDE 12.5 MG PO CAPS: 12.5000 mg | ORAL_CAPSULE | ORAL | 2 refills | Status: DC

## 2016-10-12 NOTE — Patient Instructions (Addendum)
No change in medication today. I will check your electrolytes, kidney, liver tests, and cholesterol. If blood sugar or cholesterol is elevated, may need to repeat those with a lab only visit after you have been fasting for 8 hours.   Recheck in 6 months for physical, let me know if you have any questions in the meantime.  When you are ready to quit smoking, let me know if I can help. [facility] offers smoking cessation clinics. Registration is required. To register call [phone] or register online at [email or web address]    IF you received an x-ray today, you will receive an invoice from [facility]. Please contact [facility] Radiology at [phone] with questions or concerns regarding your invoice.   IF you received labwork today, you will receive an invoice from [facility]. Please contact [facility] at [phone] with questions or concerns regarding your invoice.   Our billing staff will not be able to assist you with questions regarding bills from these companies.  You will be contacted with the lab results as soon as they are available. The fastest way to get your results is to activate your My Chart account. Instructions are located on the last page of this paperwork. If you have not heard from [place] regarding the results in 2 weeks, please contact this office.

## 2016-10-12 NOTE — Progress Notes (Signed)
By signing my name below, I, Mesha Guinyard, attest that this documentation has been prepared under the direction and in the presence of [name], MD.  Electronically Signed: Verlee Monte, Medical Scribe. 10/12/16. 4:33 PM.  Subjective:    Patient ID: Kellie Mason, female    DOB: 07/06/1961, 56 y.o.   MRN: [id number]  HPI Chief Complaint  Patient presents with  . Follow-up    blood pressure    HPI Comments: [name] is a 56 y.o. female who presents to the Primary Care at [facility] and [facility] for HTN follow-up. She takes norvasc 2.5 mg, HCTZ 12.5 mg she has been followed by neuro for B12 neuropathy. Health maintienence reviewed. Pt had a chocolate muffin before lunch and isn't fasting. She plans on working out after the weather warms up.  HTN: Pt hasn't been checking her bp at home. Denies experiencing any negative side effects from her medication. Denies, unexpected weight loss, fever, or other negative side effects. Lab Results  Component Value Date   CREATININE 0.60 01/03/2016   BP Readings from Last 3 Encounters:  10/12/16 137/83  08/08/16 132/70  04/14/16 120/82   Neuropathy: Reports her neuropathy has move into her heel and arch, preventing her from doing yoga and marathons. She states the current shoes she's wearing is the only pair she can wear. Pt continues to take 1000 mcg of Vit B12 for her neuropathy and is followed by Dr. Jaynee Eagles - office visit recheck July 2012.   Tobacco Cessation: Pt smokes 0.5 ppd and she's not ready for cessation - plans to opt on cold-turkey when she's ready. Denies cough and SOB.  Colon CA Screening: Last done in 2006. Pt hasn't scheduled another colonoscopy due to financial concerns.  Patient Active Problem List   Diagnosis Date Noted  . Vitamin B12 deficiency neuropathy ([place]) 08/08/2016  . Neuropathy ([place]) 01/04/2016  . History of basal cell carcinoma 11/18/2012  . Atopic dermatitis 04/12/2012  . Hypertension  02/06/2011  . Tobacco use disorder 02/06/2011  . Healthcare maintenance 02/06/2011   Past Medical History:  Diagnosis Date  . Arthritis   . Basal cell carcinoma of anterior chest    shave bx - 08/12/12 - Dr. Wilhemina Bonito.  . Chest pain   . Cough   . Dizziness - light-headed   . Headache   . Hypertension   . Palpitations   . Tobacco abuse    No ready to quit   Past Surgical History:  Procedure Laterality Date  . ELECTROCARDIOGRAM    . KNEE ARTHROSCOPY Left    Allergies  Allergen Reactions  . Cannabinoids   . Tetracyclines & Related   . Codeine Rash   Prior to Admission medications   Medication Sig Start Date End Date Taking? Authorizing Provider  amLODipine (NORVASC) 2.5 MG tablet Take 1 tablet (2.5 mg total) by mouth daily. 04/14/16   Wendie Agreste, MD  Calcium Carbonate-Vit D-Min (CALTRATE PLUS PO) Take by mouth.      Historical Provider, MD  estradiol (ESTRACE) 0.5 MG tablet Take 0.5 mg by mouth daily.    Historical Provider, MD  gabapentin (NEURONTIN) 300 MG capsule In the evenings can 1-3 tablets before bed. 08/08/16   Melvenia Beam, MD  hydrochlorothiazide (MICROZIDE) 12.5 MG capsule Take 1 capsule (12.5 mg total) by mouth every morning. 04/24/16   Wendie Agreste, MD  OVER THE COUNTER MEDICATION Reported on 09/29/2015    Historical Provider, MD  Progesterone Micronized (PROGESTERONE  PO) Take by mouth.    Historical Provider, MD   Social History   Social History  . Marital status: Married    Spouse name: N/A  . Number of children: N/A  . Years of education: N/A   Occupational History  . Works for Tree surgeon   Social History Main Topics  . Smoking status: Current Every Day Smoker    Packs/day: 0.50  . Smokeless tobacco: Never Used  . Alcohol use 1.2 oz/week    2 Standard drinks or equivalent per week  . Drug use: No  . Sexual activity: Yes   Other Topics Concern  . Not on file   Social History Narrative   Married. Exercises  5 days per week...speed walking, 3-4 miles   Review of Systems  Constitutional: Negative for fatigue, fever and unexpected weight change.  Respiratory: Negative for cough, chest tightness and shortness of breath.   Cardiovascular: Negative for chest pain, palpitations and leg swelling.  Gastrointestinal: Negative for abdominal pain and blood in stool.  Neurological: Negative for dizziness, syncope, light-headedness and headaches.   Objective:  Physical Exam  Constitutional: She is oriented to person, place, and time. She appears well-developed and well-nourished.  HENT:  Head: Normocephalic and atraumatic.  Eyes: Conjunctivae and EOM are normal. Pupils are equal, round, and reactive to light.  Neck: Carotid bruit is not present.  Cardiovascular: Normal rate, regular rhythm, normal heart sounds and intact distal pulses.  Exam reveals no friction rub.   No murmur heard. Pulmonary/Chest: Effort normal and breath sounds normal. No respiratory distress. She has no wheezes. She has no rales.  Abdominal: Soft. She exhibits no pulsatile midline mass. There is no tenderness.  Musculoskeletal: She exhibits no edema (lower extrmity).  Neurological: She is alert and oriented to person, place, and time.  Skin: Skin is warm and dry.  Psychiatric: She has a normal mood and affect. Her behavior is normal.  Vitals reviewed.   Vitals:   10/12/16 1607  BP: 137/83  Pulse: 64  Resp: 16  Temp: 98.7 F (37.1 C)  TempSrc: Oral  SpO2: 99%  Weight: 118 lb 3.2 oz (53.6 kg)  Height: 5' 5"  (1.651 m)   Body mass index is 19.67 kg/m. Assessment & Plan:   [name] is a 56 y.o. female Essential hypertension - Plan: amLODipine (NORVASC) 2.5 MG tablet, hydrochlorothiazide (MICROZIDE) 12.5 MG capsule, CMP14+EGFR  - stable. No change in meds at this time. Labs ordered (fasting - lab only visit)  Tobacco abuse  - cessation discussed. Resources discussed when ready to quit if needed.   Screening for  hyperlipidemia - Plan: Lipid panel, CMP14+EGFR, CANCELED: Comprehensive metabolic panel, CANCELED: Lipid panel  - fasting at lab visit - orders provided.   Meds ordered this encounter  Medications  . amLODipine (NORVASC) 2.5 MG tablet    Sig: Take 1 tablet (2.5 mg total) by mouth daily.    Dispense:  90 tablet    Refill:  2  . hydrochlorothiazide (MICROZIDE) 12.5 MG capsule    Sig: Take 1 capsule (12.5 mg total) by mouth every morning.    Dispense:  90 capsule    Refill:  2   Patient Instructions   No change in medication today. I will check your electrolytes, kidney, liver tests, and cholesterol. If blood sugar or cholesterol is elevated, may need to repeat those with a lab only visit after you have been fasting for 8 hours.   Recheck  in 6 months for physical, let me know if you have any questions in the meantime.  When you are ready to quit smoking, let me know if I can help. [facility] offers smoking cessation clinics. Registration is required. To register call [phone] or register online at [email or web address]    IF you received an x-ray today, you will receive an invoice from [facility]. Please contact [facility] at [phone] with questions or concerns regarding your invoice.   IF you received labwork today, you will receive an invoice from [facility]. Please contact [facility] at [phone] with questions or concerns regarding your invoice.   Our billing staff will not be able to assist you with questions regarding bills from these companies.  You will be contacted with the lab results as soon as they are available. The fastest way to get your results is to activate your My Chart account. Instructions are located on the last page of this paperwork. If you have not heard from [place] regarding the results in 2 weeks, please contact this office.       I personally performed the services described in this documentation, which was scribed in my presence. The  recorded information has been reviewed and considered for accuracy and completeness, addended by me as needed, and agree with information above.  Signed,   [name], MD Primary Care at [facility].  10/14/16 11:29 PM

## 2016-11-11 ENCOUNTER — Other Ambulatory Visit: Payer: Self-pay | Admitting: Family Medicine

## 2016-11-11 DIAGNOSIS — I10 Essential (primary) hypertension: Secondary | ICD-10-CM

## 2016-11-12 NOTE — Telephone Encounter (Signed)
#  90 and 2 refills were ordered on March 22, did the pharmacy not receive those?

## 2016-11-13 NOTE — Telephone Encounter (Signed)
Left message on patient's home phone. Advised if she has not received refills, to let us know or have pharmacy request again as I did send 90 and 2 refills at previous visit.

## 2017-01-04 ENCOUNTER — Encounter: Payer: Self-pay | Admitting: Gastroenterology

## 2017-01-09 ENCOUNTER — Ambulatory Visit (INDEPENDENT_AMBULATORY_CARE_PROVIDER_SITE_OTHER): Payer: Managed Care, Other (non HMO) | Admitting: Family Medicine

## 2017-01-09 ENCOUNTER — Encounter: Payer: Self-pay | Admitting: Family Medicine

## 2017-01-09 ENCOUNTER — Ambulatory Visit: Payer: Managed Care, Other (non HMO)

## 2017-01-09 ENCOUNTER — Ambulatory Visit (INDEPENDENT_AMBULATORY_CARE_PROVIDER_SITE_OTHER): Payer: Managed Care, Other (non HMO)

## 2017-01-09 VITALS — BP 160/88 | HR 98 | Temp 97.7°F | Resp 16 | Ht 65.0 in | Wt 117.4 lb

## 2017-01-09 DIAGNOSIS — R042 Hemoptysis: Secondary | ICD-10-CM

## 2017-01-09 DIAGNOSIS — F411 Generalized anxiety disorder: Secondary | ICD-10-CM

## 2017-01-09 DIAGNOSIS — F17200 Nicotine dependence, unspecified, uncomplicated: Secondary | ICD-10-CM | POA: Diagnosis not present

## 2017-01-09 DIAGNOSIS — R05 Cough: Secondary | ICD-10-CM

## 2017-01-09 DIAGNOSIS — R059 Cough, unspecified: Secondary | ICD-10-CM

## 2017-01-09 DIAGNOSIS — I10 Essential (primary) hypertension: Secondary | ICD-10-CM

## 2017-01-09 LAB — POCT CBC
GRANULOCYTE PERCENT: 61.3 % (ref 37–80)
HCT, POC: 40.2 % (ref 37.7–47.9)
Hemoglobin: 14.2 g/dL (ref 12.2–16.2)
Lymph, poc: 2.4 (ref 0.6–3.4)
MCH: 31.8 pg — AB (ref 27–31.2)
MCHC: 35.3 g/dL (ref 31.8–35.4)
MCV: 90.1 fL (ref 80–97)
MID (cbc): 1.4 — AB (ref 0–0.9)
MPV: 7.1 fL (ref 0–99.8)
POC Granulocyte: 6.1 (ref 2–6.9)
POC LYMPH PERCENT: 24.6 %L (ref 10–50)
POC MID %: 14.1 % — AB (ref 0–12)
Platelet Count, POC: 442 10*3/uL — AB (ref 142–424)
RBC: 4.46 M/uL (ref 4.04–5.48)
RDW, POC: 13.9 %
WBC: 9.9 10*3/uL (ref 4.6–10.2)

## 2017-01-09 NOTE — Progress Notes (Signed)
Patient ID: Kellie Mason, female    DOB: May 01, 1961  Age: 56 y.o. MRN: [id number]  Chief Complaint  Patient presents with  . Cough    coughing up blood while at work   Coughing up blood  Subjective:   Patient ate a late lunch at her office. She got a little choked on some cheese. She started coughing and began coughing up mouthfuls of bright red blood. This continues to persist but it is slowed down considerably. She did not have any pain. She did not have any chest pain or shortness of breath. She had the urge to cough however.  She works as a Radio broadcast assistant, Designer, multimedia.  Current allergies, medications, problem list, past/family and social histories reviewed.  Objective:  BP (!) 160/88 (BP Location: Left Arm, Patient Position: Sitting, Cuff Size: Small)   Pulse 98   Temp 97.7 F (36.5 C) (Oral)   Resp 16   Ht 5\' 5"  (1.651 m)   Wt 117 lb 6.4 oz (53.3 kg)   SpO2 100%   BMI 19.54 kg/m   Anxious lady who is coughing blood. She coughs bright red blood on 2 tissues, but it seems to have slowed down considerably in the few minutes she has been here. Apparently it was more profuse when she was at work.  Her TMs are normal. Throat clear with no blood visible. Neck supple without significant nodes. Chest is clear to auscultation. Heart regular without murmurs, gallops, or arrhythmias.  Assessment & Plan:   Assessment: 1. Hemoptysis   2. Tobacco use disorder   3. Anxiety state   4. Essential hypertension   5. Cough       Plan: Chest x-ray and CBC. Will probably need a CT scan and may need a pulmonary consult/bronchoscopy. It was definitely more than a tiny bit of hemoptysis.  It has now been about 30 minutes since she came in and she is coughing very little. She is relaxed considerably and her blood pressures come down.  Orders Placed This Encounter  Procedures  . DG Chest 2 View    Standing Status:   Future    Number of Occurrences:   1    Standing Expiration Date:   01/09/2018    Order Specific Question:   Reason for Exam (SYMPTOM  OR DIAGNOSIS REQUIRED)    Answer:   acute hemoptysis in smoker    Order Specific Question:   Is the patient pregnant?    Answer:   No    Order Specific Question:   Preferred imaging location?    Answer:   External  . CT Chest W Contrast    Standing Status:   Future    Standing Expiration Date:   03/11/2018    Order Specific Question:   If indicated for the ordered procedure, I authorize the administration of contrast media per Radiology protocol    Answer:   Yes    Order Specific Question:   Reason for Exam (SYMPTOM  OR DIAGNOSIS REQUIRED)    Answer:   hemoptysis in smoker, possible nodule on cxr    Order Specific Question:   Is patient pregnant?    Answer:   No    Order Specific Question:   Preferred imaging location?    Answer:   External    Order Specific Question:   Call Results- Best Contact Number?    Answer:   Call [phone] and give report to PA   (or Dr. Linna Darner [phone])    Order  Specific Question:   Radiology Contrast Protocol - do NOT remove file path    Answer:   [email or web address]  . CBC  . Ambulatory referral to Pulmonology    Referral Priority:   Urgent    Referral Type:   Consultation    Referral Reason:   Specialty Services Required    Requested Specialty:   Pulmonary Disease    Number of Visits Requested:   1  . POCT CBC    No orders of the defined types were placed in this encounter.  Chest x-ray: Official reading not yet done since there is some problem getting the pictures transmitted. There is a left perihilar area that looks like a suspicious spot to me but will wait radiology reading.----Spoke with radiologist and he feels chest x-ray is the same as prior films, that the area I'm looking at is probably the pulmonary artery.   Results for orders placed or performed in visit on 01/09/17  POCT CBC  Result Value Ref Range   WBC 9.9 4.6 - 10.2 K/uL   Lymph, poc 2.4 0.6 -  3.4   POC LYMPH PERCENT 24.6 10 - 50 %L   MID (cbc) 1.4 (A) 0 - 0.9   POC MID % 14.1 (A) 0 - 12 %M   POC Granulocyte 6.1 2 - 6.9   Granulocyte percent 61.3 37 - 80 %G   RBC 4.46 4.04 - 5.48 M/uL   Hemoglobin 14.2 12.2 - 16.2 g/dL   HCT, POC 40.2 37.7 - 47.9 %   MCV 90.1 80 - 97 fL   MCH, POC 31.8 (A) 27 - 31.2 pg   MCHC 35.3 31.8 - 35.4 g/dL   RDW, POC 13.9 %   Platelet Count, POC 442 (A) 142 - 424 K/uL   MPV 7.1 0 - 99.8 fL   Will refer for a CT scan of the lungs and make a pulmonary referral.  Patient Instructions   We are scheduling you a CT scan for tonight or tomorrow if possible to check your lungs better.  Also will make you a referral to a lung specialist.  In the event of coughing up a lot more blood please probably go to the emergency room.   STAT CT SCHEDULED @ [place] main entrance by valet parking and they will direct you to Radiology. APPOINTMENT TIME IS 2:00 PM   PLEASE ARRIVE AT 1:45 PM AND DRINK ONLY LIQUIDS 4 HOURS PRIOR TO APPOINTMENT TIME. 10AM    IF you received an x-ray today, you will receive an invoice from [facility]. Please contact [facility] Radiology at [phone] with questions or concerns regarding your invoice.   IF you received labwork today, you will receive an invoice from [facility]. Please contact [facility] at [phone] with questions or concerns regarding your invoice.   Our billing staff will not be able to assist you with questions regarding bills from these companies.  You will be contacted with the lab results as soon as they are available. The fastest way to get your results is to activate your My Chart account. Instructions are located on the last page of this paperwork. If you have not heard from [place] regarding the results in 2 weeks, please contact this office.         No Follow-up on file.   HOPPER,DAVID, MD 01/09/2017

## 2017-01-09 NOTE — Patient Instructions (Addendum)
We are scheduling you a CT scan for tonight or tomorrow if possible to check your lungs better.  Also will make you a referral to a lung specialist.  In the event of coughing up a lot more blood please probably go to the emergency room.   STAT CT SCHEDULED @ [place] main entrance by valet parking and they will direct you to Radiology. APPOINTMENT TIME IS 2:00 PM   PLEASE ARRIVE AT 1:45 PM AND DRINK ONLY LIQUIDS 4 HOURS PRIOR TO APPOINTMENT TIME. 10AM    IF you received an x-ray today, you will receive an invoice from [facility]. Please contact [facility] Radiology at [phone] with questions or concerns regarding your invoice.   IF you received labwork today, you will receive an invoice from [facility]. Please contact [facility] at [phone] with questions or concerns regarding your invoice.   Our billing staff will not be able to assist you with questions regarding bills from these companies.  You will be contacted with the lab results as soon as they are available. The fastest way to get your results is to activate your My Chart account. Instructions are located on the last page of this paperwork. If you have not heard from [place] regarding the results in 2 weeks, please contact this office.

## 2017-01-09 NOTE — Addendum Note (Signed)
Addended by: Gari Crown D on: 01/09/2017 05:53 PM   Modules accepted: Orders

## 2017-01-10 ENCOUNTER — Ambulatory Visit (HOSPITAL_COMMUNITY)
Admission: RE | Admit: 2017-01-10 | Discharge: 2017-01-10 | Disposition: A | Discharge status: Home / Self Care | Payer: Managed Care, Other (non HMO) | Source: Ambulatory Visit | Attending: Family Medicine | Admitting: Family Medicine

## 2017-01-10 DIAGNOSIS — R042 Hemoptysis: Secondary | ICD-10-CM | POA: Insufficient documentation

## 2017-01-10 DIAGNOSIS — I2510 Atherosclerotic heart disease of native coronary artery without angina pectoris: Secondary | ICD-10-CM | POA: Insufficient documentation

## 2017-01-10 DIAGNOSIS — F17200 Nicotine dependence, unspecified, uncomplicated: Secondary | ICD-10-CM | POA: Insufficient documentation

## 2017-01-10 MED ORDER — IOPAMIDOL (ISOVUE-300) INJECTION 61%
INTRAVENOUS | Status: AC
  Administered 2017-01-10: 75 mL
  Filled 2017-01-10: qty 75

## 2017-01-11 ENCOUNTER — Encounter: Payer: Self-pay | Admitting: Family Medicine

## 2017-01-18 ENCOUNTER — Institutional Professional Consult (permissible substitution): Payer: Managed Care, Other (non HMO) | Admitting: Internal Medicine

## 2017-01-24 ENCOUNTER — Other Ambulatory Visit: Payer: Self-pay | Admitting: Neurology

## 2017-01-24 DIAGNOSIS — G629 Polyneuropathy, unspecified: Secondary | ICD-10-CM

## 2017-02-13 ENCOUNTER — Ambulatory Visit: Payer: Managed Care, Other (non HMO) | Admitting: Neurology

## 2017-02-13 ENCOUNTER — Telehealth: Payer: Self-pay

## 2017-02-13 NOTE — Telephone Encounter (Signed)
Pt called and cancelled same day appt

## 2017-02-19 ENCOUNTER — Encounter: Payer: Self-pay | Admitting: Neurology

## 2017-02-27 ENCOUNTER — Encounter (INDEPENDENT_AMBULATORY_CARE_PROVIDER_SITE_OTHER): Payer: Self-pay

## 2017-02-27 ENCOUNTER — Ambulatory Visit (INDEPENDENT_AMBULATORY_CARE_PROVIDER_SITE_OTHER): Payer: Managed Care, Other (non HMO) | Admitting: Gastroenterology

## 2017-02-27 ENCOUNTER — Encounter: Payer: Self-pay | Admitting: Gastroenterology

## 2017-02-27 VITALS — BP 110/72 | HR 104 | Ht 65.0 in | Wt 116.5 lb

## 2017-02-27 DIAGNOSIS — K5900 Constipation, unspecified: Secondary | ICD-10-CM

## 2017-02-27 DIAGNOSIS — K625 Hemorrhage of anus and rectum: Secondary | ICD-10-CM | POA: Diagnosis not present

## 2017-02-27 DIAGNOSIS — K589 Irritable bowel syndrome without diarrhea: Secondary | ICD-10-CM | POA: Diagnosis not present

## 2017-02-27 MED ORDER — BENEFIBER PO POWD: ORAL | 0 refills | Status: DC

## 2017-02-27 MED ORDER — SUPREP BOWEL PREP KIT 17.5-3.13-1.6 GM/177ML PO SOLN: ORAL | 0 refills | Status: DC

## 2017-02-27 MED ORDER — AMBULATORY NON FORMULARY MEDICATION: Status: DC

## 2017-02-27 NOTE — Progress Notes (Signed)
Kellie Mason    [id number]    Feb 05, 1961  Primary Care Physician:Greene, Ranell Patrick, MD  Referring Physician: Wendie Agreste, MD [address] [place], [place] [address]  Chief complaint: Bright red blood per rectum, irritable bowel syndrome  HPI: 56 year old female previously followed by Dr. Olevia Perches is here to reestablish care. Last colonoscopy in February 2006 was unremarkable except for internal and external hemorrhoids. She has history of irritable bowel syndrome. Patient's complaining of incomplete evacuation small-volume bright red blood per rectum when she wipes.  She also has intermittent lower abdominal pain. Denies any nausea, vomiting, dysphagia or melena   Outpatient Encounter Prescriptions as of 02/27/2017  Medication Sig  . amLODipine (NORVASC) 2.5 MG tablet Take 1 tablet (2.5 mg total) by mouth daily.  . Biotin (BIOTIN 5000) 5 MG CAPS Take 1 capsule by mouth daily.  . Calcium Carbonate-Vit D-Min (CALTRATE PLUS PO) Take by mouth.    . estradiol (ESTRACE) 0.5 MG tablet Take 0.5 mg by mouth daily.  Marland Kitchen gabapentin (NEURONTIN) 300 MG capsule In the evenings can 1-3 tablets before bed.  . hydrochlorothiazide (MICROZIDE) 12.5 MG capsule Take 1 capsule (12.5 mg total) by mouth every morning.  . Progesterone Micronized (PROGESTERONE PO) Take by mouth.  . vitamin B-12 (CYANOCOBALAMIN) 1000 MCG tablet Take 1,000 mcg by mouth daily.  . [DISCONTINUED] OVER THE COUNTER MEDICATION Reported on 09/29/2015   No facility-administered encounter medications on file as of 02/27/2017.     Allergies as of 02/27/2017 - Review Complete 02/27/2017  Allergen Reaction Noted  . Cannabinoids  12/23/2010  . Tetracyclines & related  12/23/2010  . Codeine Rash 09/27/2011    Past Medical History:  Diagnosis Date  . Arthritis   . Basal cell carcinoma of anterior chest    shave bx - 08/12/12 - Dr. Wilhemina Bonito.  . Chest pain   . Cough   . Dizziness - light-headed   . Headache   .  Hypertension   . Palpitations   . Tobacco abuse    No ready to quit    Past Surgical History:  Procedure Laterality Date  . ELECTROCARDIOGRAM    . KNEE ARTHROSCOPY Left     Family History  Problem Relation Age of Onset  . Hypertension Mother   . Cancer Mother   . Neuropathy Mother   . Hypertension Sister   . Cancer Father   . Hypertension Maternal Grandfather   . Neuropathy Brother     Social History   Social History  . Marital status: Married    Spouse name: N/A  . Number of children: N/A  . Years of education: N/A   Occupational History  . Works for Tree surgeon   Social History Main Topics  . Smoking status: Current Every Day Smoker    Packs/day: 0.50  . Smokeless tobacco: Never Used  . Alcohol use 1.2 oz/week    2 Standard drinks or equivalent per week  . Drug use: No  . Sexual activity: Yes   Other Topics Concern  . Not on file   Social History Narrative   Married. Exercises 5 days per week...speed walking, 3-4 miles      Review of systems: Review of Systems  Constitutional: Negative for fever and chills.  HENT: Negative.   Eyes: Negative for blurred vision.  Respiratory: Negative for cough, shortness of breath and wheezing.   Cardiovascular: Negative for chest pain and  palpitations.  Gastrointestinal: as per HPI Genitourinary: Negative for dysuria, urgency, frequency and hematuria.  Musculoskeletal: Negative for myalgias, back pain and joint pain.  Skin: Negative for itching and rash.  Neurological: Negative for dizziness, tremors, focal weakness, seizures and loss of consciousness.  Endo/Heme/Allergies: Positive for seasonal allergies.  Psychiatric/Behavioral: Negative for depression, suicidal ideas and hallucinations.  All other systems reviewed and are negative.   Physical Exam: Vitals:   02/27/17 0828  BP: (!) 172/110  Pulse: (!) 104   Body mass index is 19.39 kg/m. Gen:      No acute distress HEENT:   EOMI, sclera anicteric Neck:     No masses; no thyromegaly Lungs:    Clear to auscultation bilaterally; normal respiratory effort CV:         Regular rate and rhythm; no murmurs Abd:      + bowel sounds; soft, non-tender; no palpable masses, no distension Ext:    No edema; adequate peripheral perfusion Skin:      Warm and dry; no rash Neuro: alert and oriented x 3 Psych: normal mood and affect  Data Reviewed:  Reviewed labs, radiology imaging, old records and pertinent past GI work up   Assessment and Plan/Recommendations:  56 year old female with history of irritable bowel syndrome, constipation here with complaints of intermittent bright red blood per rectum Patient is past due for colorectal cancer screening We'll schedule it The risks and benefits as well as alternatives of endoscopic procedure(s) have been discussed and reviewed. All questions answered. The patient agrees to proceed.  Small-volume intermittent bright red blood per rectum likely secondary to internal hemorrhoids bleeding Benefiber 1 tablespoon 3 times daily with meals If continues to have persistent bleeding will consider hemorrhoidal band ligation based on findings of colonoscopy  To prevent constipation advise patient to increase dietary fiber and fluid intake MiraLAX 1 capful daily as needed  Abdominal bloating and intermittent abdominal cramps/lower abdominal discomfort Peppermint oil capsule 3 times daily as needed   Greater than 50% of the time used for counseling as well as treatment plan and follow-up. She had multiple questions which were answered to her satisfaction  K. Denzil Magnuson , MD [phone] Mon-Fri 8a-5p [phone] after 5p, weekends, holidays  CC: Wendie Agreste, MD

## 2017-02-27 NOTE — Patient Instructions (Signed)
You have been scheduled for a colonoscopy. Please follow written instructions given to you at your visit today.  Please pick up your prep supplies at the pharmacy within the next 1-3 days. If you use inhalers (even only as needed), please bring them with you on the day of your procedure. Your physician has requested that you go to www.startemmi.com and enter the access code given to you at your visit today. This web site gives a general overview about your procedure. However, you should still follow specific instructions given to you by our office regarding your preparation for the procedure.  Please purchase the following medications over the counter and take as directed: Benefiber (Take 1 tablespoon three times a day with meals)  IBgard (Take 1 capsule three times a day as needed)  Normal BMI (Body Mass Index- based on height and weight) is between 19 and 25. Your BMI today is Body mass index is 19.39 kg/m. Marland Kitchen Please consider follow up  regarding your BMI with your Primary Care Provider.

## 2017-03-07 ENCOUNTER — Encounter: Payer: Self-pay | Admitting: Gastroenterology

## 2017-03-21 ENCOUNTER — Ambulatory Visit (AMBULATORY_SURGERY_CENTER): Payer: Managed Care, Other (non HMO) | Admitting: Gastroenterology

## 2017-03-21 ENCOUNTER — Encounter: Payer: Self-pay | Admitting: Gastroenterology

## 2017-03-21 VITALS — BP 125/75 | HR 65 | Temp 97.7°F | Resp 11 | Ht 65.0 in | Wt 116.0 lb

## 2017-03-21 DIAGNOSIS — K921 Melena: Secondary | ICD-10-CM | POA: Diagnosis not present

## 2017-03-21 DIAGNOSIS — K5900 Constipation, unspecified: Secondary | ICD-10-CM

## 2017-03-21 DIAGNOSIS — R197 Diarrhea, unspecified: Secondary | ICD-10-CM

## 2017-03-21 MED ORDER — SODIUM CHLORIDE 0.9 % IV SOLN: 500.0000 mL | INTRAVENOUS | Status: DC

## 2017-03-21 MED ORDER — HYDROCORTISONE 2.5 % RE CREA: 1.0000 "application " | TOPICAL_CREAM | RECTAL | 0 refills | Status: AC

## 2017-03-21 NOTE — Progress Notes (Signed)
To recovery, report to RN, VSS. 

## 2017-03-21 NOTE — Patient Instructions (Signed)
YOU HAD AN ENDOSCOPIC PROCEDURE TODAY AT [facility]:   Refer to the procedure report that was given to you for any specific questions about what was found during the examination.  If the procedure report does not answer your questions, please call your gastroenterologist to clarify.  If you requested that your care partner not be given the details of your procedure findings, then the procedure report has been included in a sealed envelope for you to review at your convenience later.  YOU SHOULD EXPECT: Some feelings of bloating in the abdomen. Passage of more gas than usual.  Walking can help get rid of the air that was put into your GI tract during the procedure and reduce the bloating. If you had a lower endoscopy (such as a colonoscopy or flexible sigmoidoscopy) you may notice spotting of blood in your stool or on the toilet paper. If you underwent a bowel prep for your procedure, you may not have a normal bowel movement for a few days.  Please Note:  You might notice some irritation and congestion in your nose or some drainage.  This is from the oxygen used during your procedure.  There is no need for concern and it should clear up in a day or so.  SYMPTOMS TO REPORT IMMEDIATELY:   Following lower endoscopy (colonoscopy or flexible sigmoidoscopy):  Excessive amounts of blood in the stool  Significant tenderness or worsening of abdominal pains  Swelling of the abdomen that is new, acute  Fever of 100F or higher   For urgent or emergent issues, a gastroenterologist can be reached at any hour by calling [phone].   DIET:  We do recommend a small meal at first, but then you may proceed to your regular diet.  Drink plenty of fluids but you should avoid alcoholic beverages for 24 hours.  ACTIVITY:  You should plan to take it easy for the rest of today and you should NOT DRIVE or use heavy machinery until tomorrow (because of the sedation medicines used during the test).     FOLLOW UP: Our staff will call the number listed on your records the next business day following your procedure to check on you and address any questions or concerns that you may have regarding the information given to you following your procedure. If we do not reach you, we will leave a message.  However, if you are feeling well and you are not experiencing any problems, there is no need to return our call.  We will assume that you have returned to your regular daily activities without incident.  If any biopsies were taken you will be contacted by phone or by letter within the next 1-3 weeks.  Please call us at [phone] if you have not heard about the biopsies in 3 weeks.    SIGNATURES/CONFIDENTIALITY: You and/or your care partner have signed paperwork which will be entered into your electronic medical record.  These signatures attest to the fact that that the information above on your After Visit Summary has been reviewed and is understood.  Full responsibility of the confidentiality of this discharge information lies with you and/or your care-partner.   Diverticulosis and hemorrhoid information given.  NO ASPIRIN, ASPIRIN CONTAINING PRODUCTS (BC OR GOODY POWDERS) OR NSAIDS (IBUPROFEN, ADVIL, ALEVE, AND MOTRIN) FOR 2 weeks; TYLENOL IS OK TO TAKE  Repeat colonoscopy 10 years-2028

## 2017-03-21 NOTE — Progress Notes (Signed)
Called to room to assist during endoscopic procedure.  Patient ID and intended procedure confirmed with present staff. Received instructions for my participation in the procedure from the performing physician.  

## 2017-03-21 NOTE — Op Note (Signed)
[facility] Patient Name: Kellie Mason Procedure Date: 03/21/2017 3:34 PM MRN: [id number] Endoscopist: Mauri Pole , MD Age: 56 Referring MD:  Date of Birth: 1961-01-27 Gender: Female Account #: [id number] Procedure:                Colonoscopy Indications:              Evaluation of unexplained GI bleeding Medicines:                Monitored Anesthesia Care Procedure:                Pre-Anesthesia Assessment:                           - Prior to the procedure, a History and Physical                            was performed, and patient medications and                            allergies were reviewed. The patient's tolerance of                            previous anesthesia was also reviewed. The risks                            and benefits of the procedure and the sedation                            options and risks were discussed with the patient.                            All questions were answered, and informed consent                            was obtained. Prior Anticoagulants: The patient has                            taken no previous anticoagulant or antiplatelet                            agents. ASA Grade Assessment: II - A patient with                            mild systemic disease. After reviewing the risks                            and benefits, the patient was deemed in                            satisfactory condition to undergo the procedure.                           After obtaining informed consent, the colonoscope  was passed under direct vision. Throughout the                            procedure, the patient's blood pressure, pulse, and                            oxygen saturations were monitored continuously. The                            Colonoscope was introduced through the anus and                            advanced to the the cecum, identified by                            appendiceal orifice and  ileocecal valve. The                            colonoscopy was performed without difficulty. The                            patient tolerated the procedure well. The quality                            of the bowel preparation was excellent. The                            ileocecal valve, appendiceal orifice, and rectum                            were photographed. Scope In: 3:41:14 PM Scope Out: 3:51:54 PM Scope Withdrawal Time: 0 hours 6 minutes 32 seconds  Total Procedure Duration: 0 hours 10 minutes 40 seconds  Findings:                 The perianal and digital rectal examinations were                            normal.                           A few small-mouthed diverticula were found in the                            sigmoid colon.                           Bleeding internal hemorrhoids were found during                            retroflexion. The hemorrhoids were medium-sized.                           Normal mucosa was found in the entire colon.  Biopsies were taken with a cold forceps for                            histology. Complications:            No immediate complications. Estimated Blood Loss:     Estimated blood loss was minimal. Impression:               - Diverticulosis in the sigmoid colon.                           - Bleeding internal hemorrhoids.                           - Normal mucosa in the entire examined colon.                            Biopsied. Recommendation:           - Patient has a contact number available for                            emergencies. The signs and symptoms of potential                            delayed complications were discussed with the                            patient. Return to normal activities tomorrow.                            Written discharge instructions were provided to the                            patient.                           - Resume previous diet.                           -  Continue present medications.                           - Await pathology results.                           - No aspirin, ibuprofen, naproxen, or other                            non-steroidal anti-inflammatory drugs.                           - Repeat colonoscopy in 10 years for surveillance                            based on pathology results.                           -  Anusol suppository per rectum at bedtime daily X                            7-10 days                           - Return to GI clinic for hemorrhoidal band ligation Mauri Pole, MD 03/21/2017 3:57:03 PM This report has been signed electronically.

## 2017-03-22 ENCOUNTER — Telehealth: Payer: Self-pay

## 2017-03-22 NOTE — Telephone Encounter (Signed)
  Follow up Call-  Call back number 03/21/2017  Post procedure Call Back phone  # [phone]  Permission to leave phone message Yes  Some recent data might be hidden     Patient questions:  Do you have a fever, pain , or abdominal swelling? No. Pain Score  0 *  Have you tolerated food without any problems? Yes.    Have you been able to return to your normal activities? Yes.    Do you have any questions about your discharge instructions: Diet   No. Medications  No. Follow up visit  No.  Do you have questions or concerns about your Care? No.  Actions: * If pain score is 4 or above: No action needed, pain <4.  No problems noted per pt. maw

## 2017-03-23 ENCOUNTER — Telehealth: Payer: Self-pay | Admitting: Gastroenterology

## 2017-03-27 NOTE — Telephone Encounter (Signed)
Patient calling back regarding this. Best call back # [phone].

## 2017-03-27 NOTE — Telephone Encounter (Signed)
Discussed how to use the anusol cream internally. Set up her first banding appointment.

## 2017-03-29 ENCOUNTER — Ambulatory Visit: Payer: Managed Care, Other (non HMO) | Admitting: Neurology

## 2017-03-30 ENCOUNTER — Encounter: Payer: Self-pay | Admitting: Gastroenterology

## 2017-04-03 ENCOUNTER — Encounter: Payer: Self-pay | Admitting: Neurology

## 2017-04-03 ENCOUNTER — Ambulatory Visit (INDEPENDENT_AMBULATORY_CARE_PROVIDER_SITE_OTHER): Payer: Self-pay | Admitting: Neurology

## 2017-04-03 VITALS — BP 143/87 | HR 87 | Ht 65.0 in | Wt 115.6 lb

## 2017-04-03 DIAGNOSIS — I10 Essential (primary) hypertension: Secondary | ICD-10-CM

## 2017-04-03 DIAGNOSIS — E538 Deficiency of other specified B group vitamins: Secondary | ICD-10-CM

## 2017-04-03 DIAGNOSIS — G629 Polyneuropathy, unspecified: Secondary | ICD-10-CM

## 2017-04-03 MED ORDER — GABAPENTIN 300 MG PO CAPS: ORAL_CAPSULE | ORAL | 11 refills | Status: DC

## 2017-04-03 MED ORDER — AMLODIPINE BESYLATE 2.5 MG PO TABS: 2.5000 mg | ORAL_TABLET | Freq: Every day | ORAL | 11 refills | Status: DC

## 2017-04-03 MED ORDER — HYDROCHLOROTHIAZIDE 12.5 MG PO CAPS: 12.5000 mg | ORAL_CAPSULE | ORAL | 11 refills | Status: DC

## 2017-04-03 NOTE — Progress Notes (Signed)
Patient just here for labs

## 2017-04-03 NOTE — Patient Instructions (Addendum)
Famotidine (Pepcid as a prescription, Pepcid-AC as an OTC medication) Cimetidine (Tagamet and Tagamet-HB) Nizatidine (Axid and Axid AR) Ranitidine (Zantac and Zantac 75)  Primary Care:  [facility]

## 2017-04-04 ENCOUNTER — Telehealth: Payer: Self-pay | Admitting: *Deleted

## 2017-04-04 NOTE — Telephone Encounter (Signed)
-----   Message from Melvenia Beam, MD sent at 04/04/2017  9:03 AM EDT ----- B12 normal, thanks

## 2017-04-04 NOTE — Telephone Encounter (Signed)
Called and LVM for pt letting her know Vit B12 normal per AA,MD note. Gave [facility] phone number if she has further questions or concerns.

## 2017-04-05 LAB — B12 AND FOLATE PANEL
Folate: 11.3 ng/mL (ref >3.0)
VITAMIN B 12: 502 pg/mL (ref 232–1245)

## 2017-04-05 LAB — METHYLMALONIC ACID, SERUM: Methylmalonic Acid: 101 nmol/L (ref 0–378)

## 2017-04-10 ENCOUNTER — Encounter: Payer: Self-pay | Admitting: Family Medicine

## 2017-04-10 ENCOUNTER — Ambulatory Visit (INDEPENDENT_AMBULATORY_CARE_PROVIDER_SITE_OTHER): Payer: Managed Care, Other (non HMO) | Admitting: Family Medicine

## 2017-04-10 DIAGNOSIS — S161XXA Strain of muscle, fascia and tendon at neck level, initial encounter: Secondary | ICD-10-CM | POA: Diagnosis not present

## 2017-04-10 DIAGNOSIS — R109 Unspecified abdominal pain: Secondary | ICD-10-CM

## 2017-04-10 MED ORDER — CYCLOBENZAPRINE HCL 5 MG PO TABS: ORAL_TABLET | ORAL | 0 refills | Status: DC
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
## 2017-05-21 ENCOUNTER — Encounter: Payer: Managed Care, Other (non HMO) | Admitting: Gastroenterology

## 2017-06-18 ENCOUNTER — Ambulatory Visit (INDEPENDENT_AMBULATORY_CARE_PROVIDER_SITE_OTHER): Payer: Managed Care, Other (non HMO) | Admitting: Family Medicine

## 2017-06-18 ENCOUNTER — Encounter: Payer: Self-pay | Admitting: Family Medicine

## 2017-06-18 ENCOUNTER — Other Ambulatory Visit: Payer: Self-pay

## 2017-06-18 VITALS — BP 124/72 | HR 83 | Temp 98.4°F | Resp 18 | Ht 65.0 in | Wt 119.6 lb

## 2017-06-18 DIAGNOSIS — I10 Essential (primary) hypertension: Secondary | ICD-10-CM | POA: Diagnosis not present

## 2017-06-18 DIAGNOSIS — L309 Dermatitis, unspecified: Secondary | ICD-10-CM | POA: Diagnosis not present

## 2017-06-18 DIAGNOSIS — L409 Psoriasis, unspecified: Secondary | ICD-10-CM | POA: Diagnosis not present

## 2017-06-18 DIAGNOSIS — M542 Cervicalgia: Secondary | ICD-10-CM

## 2017-06-18 DIAGNOSIS — Z13220 Encounter for screening for lipoid disorders: Secondary | ICD-10-CM

## 2017-06-18 DIAGNOSIS — Z0000 Encounter for general adult medical examination without abnormal findings: Secondary | ICD-10-CM

## 2017-06-18 MED ORDER — CLOBETASOL PROPIONATE 0.05 % EX CREA: 1.0000 "application " | TOPICAL_CREAM | Freq: Two times a day (BID) | CUTANEOUS | 1 refills | Status: DC | PRN

## 2017-06-18 NOTE — Patient Instructions (Addendum)
Continue gabapentin for now, but please follow-up so we can discuss the neuropathy and foot/ankle symptoms further.   Rare alleve if needed for neck, but that can raise your blood pressure. If neck pain is worsening, please return for xrays and to decide on other treatment.   Clobetasol cream for short-term use of hand eczema or psoriasis if needed. If persistent or frequent use needed, return for recheck of those areas. Okay to use Eucerin or Lubriderm lotion over-the-counter for hand eczema if needed.   Return to the clinic or go to the nearest emergency room if any of your symptoms worsen or new symptoms occur.  Keeping You Healthy  Get These Tests  Blood Pressure- Have your blood pressure checked by your healthcare provider at least once a year.  Normal blood pressure is 120/80.  Weight- Have your body mass index (BMI) calculated to screen for obesity.  BMI is a measure of body fat based on height and weight.  You can calculate your own BMI at [employer].it  Cholesterol- Have your cholesterol checked every year.  Diabetes- Have your blood sugar checked every year if you have high blood pressure, high cholesterol, a family history of diabetes or if you are overweight.  Pap Test - Have a pap test every 1 to 5 years if you have been sexually active.  If you are older than 65 and recent pap tests have been normal you may not need additional pap tests.  In addition, if you have had a hysterectomy  for benign disease additional pap tests are not necessary.  Mammogram-Yearly mammograms are essential for early detection of breast cancer  Screening for Colon Cancer- Colonoscopy starting at age 5. Screening may begin sooner depending on your family history and other health conditions.  Follow up colonoscopy as directed by your Gastroenterologist.  Screening for Osteoporosis- Screening begins at age 63 with bone density scanning, sooner if you are at higher risk for developing  Osteoporosis.  Get these medicines  Calcium with Vitamin D- Your body requires 1200-1500 mg of Calcium a day and [phone] IU of Vitamin D a day.  You can only absorb 500 mg of Calcium at a time therefore Calcium must be taken in 2 or 3 separate doses throughout the day.  Hormones- Hormone therapy has been associated with increased risk for certain cancers and heart disease.  Talk to your healthcare provider about if you need relief from menopausal symptoms.  Aspirin- Ask your healthcare provider about taking Aspirin to prevent Heart Disease and Stroke.  Get these Immuniztions  Flu shot- Every fall  Pneumonia shot- Once after the age of 49; if you are younger ask your healthcare provider if you need a pneumonia shot.  Tetanus- Every ten years.  Zostavax- Once after the age of 27 to prevent shingles.  Take these steps  Don't smoke- Your healthcare provider can help you quit. For tips on how to quit, ask your healthcare provider or go to www.smokefree.gov or call 1-800 QUIT-NOW.  Be physically active- Exercise 5 days a week for a minimum of 30 minutes.  If you are not already physically active, start slow and gradually work up to 30 minutes of moderate physical activity.  Try walking, dancing, bike riding, swimming, etc.  Eat a healthy diet- Eat a variety of healthy foods such as fruits, vegetables, whole grains, low fat milk, low fat cheeses, yogurt, lean meats, chicken, fish, eggs, dried beans, tofu, etc.  For more information go to www.thenutritionsource.org  Dental visit-  Brush and floss teeth twice daily; visit your dentist twice a year.  Eye exam- Visit your Optometrist or Ophthalmologist yearly.  Drink alcohol in moderation- Limit alcohol intake to one drink or less a day.  Never drink and drive.  Depression- Your emotional health is as important as your physical health.  If you're feeling down or losing interest in things you normally enjoy, please talk to your healthcare  provider.  Seat Belts- can save your life; always wear one  Smoke/Carbon Monoxide detectors- These detectors need to be installed on the appropriate level of your home.  Replace batteries at least once a year.  Violence- If anyone is threatening or hurting you, please tell your healthcare provider.  Living Will/ Health care power of attorney- Discuss with your healthcare provider and family.    IF you received an x-ray today, you will receive an invoice from [facility]. Please contact [facility] at [phone] with questions or concerns regarding your invoice.   IF you received labwork today, you will receive an invoice from [facility]. Please contact [facility] at [phone] with questions or concerns regarding your invoice.   Our billing staff will not be able to assist you with questions regarding bills from these companies.  You will be contacted with the lab results as soon as they are available. The fastest way to get your results is to activate your My Chart account. Instructions are located on the last page of this paperwork. If you have not heard from [place] regarding the results in 2 weeks, please contact this office.
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
## 2017-06-19 LAB — LIPID PANEL
CHOL/HDL RATIO: 3.1 ratio (ref 0.0–4.4)
Cholesterol, Total: 266 mg/dL — ABNORMAL HIGH (ref 100–199)
HDL: 87 mg/dL (ref >39)
LDL CALC: 154 mg/dL — AB (ref 0–99)
Triglycerides: 126 mg/dL (ref 0–149)
VLDL CHOLESTEROL CAL: 25 mg/dL (ref 5–40)

## 2017-06-19 LAB — COMPREHENSIVE METABOLIC PANEL
ALT: 20 IU/L (ref 0–32)
AST: 19 IU/L (ref 0–40)
Albumin/Globulin Ratio: 2.1 (ref 1.2–2.2)
Albumin: 4.9 g/dL (ref 3.5–5.5)
Alkaline Phosphatase: 83 IU/L (ref 39–117)
BUN/Creatinine Ratio: 16 (ref 9–23)
BUN: 12 mg/dL (ref 6–24)
Bilirubin Total: 0.3 mg/dL (ref 0.0–1.2)
CO2: 26 mmol/L (ref 20–29)
CREATININE: 0.73 mg/dL (ref 0.57–1.00)
Calcium: 9.8 mg/dL (ref 8.7–10.2)
Chloride: 97 mmol/L (ref 96–106)
GFR calc non Af Amer: 94 mL/min/{1.73_m2} (ref >59)
GFR, EST AFRICAN AMERICAN: 108 mL/min/{1.73_m2} (ref >59)
GLUCOSE: 92 mg/dL (ref 65–99)
Globulin, Total: 2.3 g/dL (ref 1.5–4.5)
Potassium: 4.6 mmol/L (ref 3.5–5.2)
Sodium: 139 mmol/L (ref 134–144)
TOTAL PROTEIN: 7.2 g/dL (ref 6.0–8.5)

## 2017-06-24 ENCOUNTER — Other Ambulatory Visit: Payer: Self-pay | Admitting: Family Medicine

## 2017-06-24 NOTE — Progress Notes (Signed)
See lab note. Hyperlipidemia, ASCVD risk:  The 10-year ASCVD risk score Mikey Bussing DC Jr., et al., 2013) is: 4.9%   Values used to calculate the score:     Age: 56 years     Sex: Female     Is Non-Hispanic African American: No     Diabetic: No     Tobacco smoker: Yes     Systolic Blood Pressure: 159 mmHg     Is BP treated: Yes     HDL Cholesterol: 87 mg/dL     Total Cholesterol: 266 mg/dL

## 2017-10-02 ENCOUNTER — Other Ambulatory Visit: Payer: Self-pay

## 2017-10-02 ENCOUNTER — Telehealth: Payer: Self-pay | Admitting: Gastroenterology

## 2017-10-02 MED ORDER — HYDROCORTISONE ACETATE 25 MG RE SUPP: 25.0000 mg | Freq: Every day | RECTAL | 1 refills | Status: DC

## 2017-10-02 NOTE — Telephone Encounter (Signed)
Patient states her hemorrhoids are bad again and wants to know if she can having banding with out office visit first. Pt canceled banding last year.

## 2017-10-02 NOTE — Telephone Encounter (Signed)
Ok to schedule for hemorrhoidal banding.

## 2017-10-02 NOTE — Telephone Encounter (Signed)
Patient had diarrhea this weekend. It is gone now. She thinks this irritated her hemorrhoids. She has an itching and burning sensation. She can feel the bulge at her rectum. Her discomfort is improving since using the hydrocortisone cream. She has new insurance. She would like to try to get the suppositories again. Hopes it will be covered by her insurance. I have put her on the schedule in April for banding. Is this okay?

## 2017-10-19 DIAGNOSIS — C44529 Squamous cell carcinoma of skin of other part of trunk: Secondary | ICD-10-CM | POA: Diagnosis not present

## 2017-11-20 ENCOUNTER — Encounter: Payer: Self-pay | Admitting: Gastroenterology

## 2017-11-20 ENCOUNTER — Ambulatory Visit: Payer: BLUE CROSS/BLUE SHIELD | Admitting: Gastroenterology

## 2017-11-20 VITALS — BP 126/78 | HR 64 | Ht 65.0 in | Wt 115.0 lb

## 2017-11-20 DIAGNOSIS — K602 Anal fissure, unspecified: Secondary | ICD-10-CM

## 2017-11-20 DIAGNOSIS — K625 Hemorrhage of anus and rectum: Secondary | ICD-10-CM | POA: Diagnosis not present

## 2017-11-20 DIAGNOSIS — K9089 Other intestinal malabsorption: Secondary | ICD-10-CM | POA: Diagnosis not present

## 2017-11-20 DIAGNOSIS — K6289 Other specified diseases of anus and rectum: Secondary | ICD-10-CM | POA: Diagnosis not present

## 2017-11-20 DIAGNOSIS — K582 Mixed irritable bowel syndrome: Secondary | ICD-10-CM | POA: Diagnosis not present

## 2017-11-20 MED ORDER — AMBULATORY NON FORMULARY MEDICATION: 1 refills | Status: DC

## 2017-11-20 MED ORDER — COLESTIPOL HCL 1 G PO TABS: 1.0000 g | ORAL_TABLET | Freq: Two times a day (BID) | ORAL | 1 refills | Status: DC

## 2017-11-20 NOTE — Patient Instructions (Addendum)
Lactose-Free Diet, Adult If you have lactose intolerance, you are not able to digest lactose. Lactose is a natural sugar found mainly in milk and milk products. You may need to avoid all foods and beverages that contain lactose. A lactose-free diet can help you do this. What do I need to know about this diet?  Do not consume foods, beverages, vitamins, minerals, or medicines with lactose. Read ingredients lists carefully.  Look for the words "lactose-free" on labels.  Use lactase enzyme drops or tablets as directed by your health care provider.  Use lactose-free milk or a milk alternative, such as soy milk, for drinking and cooking.  Make sure you get enough calcium and vitamin D in your diet. A lactose-free eating plan can be lacking in these important nutrients.  Take calcium and vitamin D supplements as directed by your health care provider. Talk to your provider about supplements if you are not able to get enough calcium and vitamin D from food. Which foods have lactose? Lactose is found in:  Milk and foods made from milk.  Yogurt.  Cheese.  Butter.  Margarine.  Sour cream.  Cream.  Whipped toppings and nondairy creamers.  Ice cream and other milk-based desserts.  Lactose is also found in foods or products made with milk or milk ingredients. To find out whether a food contains milk or a milk ingredient, look at the ingredients list. Avoid foods with the statement "May contain milk" and foods that contain:  Butter.  Cream.  Milk.  Milk solids.  Milk powder.  Whey.  Curd.  Caseinate.  Lactose.  Lactalbumin.  Lactoglobulin.  What are some alternatives to milk and foods made with milk products?  Lactose-free milk.  Soy milk with added calcium and vitamin D.  Almond, coconut, or rice milk with added calcium and vitamin D. Note that these are low in protein.  Soy products, such as soy yogurt, soy cheese, soy ice cream, and soy-based sour cream. Which  foods can I eat? Grains Breads and rolls made without milk, such as [place], [place], or [place] bread, bagels, pita, and [employer]. Corn tortillas, corn meal, grits, and polenta. Crackers without lactose or milk solids, such as soda crackers and graham crackers. Cooked or dry cereals without lactose or milk solids. Pasta, quinoa, couscous, barley, oats, bulgur, farro, rice, wild rice, or other grains prepared without milk or lactose. Plain popcorn. Vegetables Fresh, frozen, and canned vegetables without cheese, cream, or butter sauces. Fruits All fresh, canned, frozen, or dried fruits that are not processed with lactose. Meats and Other Protein Sources Plain beef, chicken, fish, [place], lamb, veal, pork, wild game, or ham. Kosher-prepared meat products. Strained or junior meats that do not contain milk. Eggs. Soy meat substitutes. Beans, lentils, and hummus. Tofu. Nuts and seeds. Peanut or other nut butters without lactose. Soups, casseroles, and mixed dishes without cheese, cream, or milk. Dairy Lactose-free milk. Soy, rice, or almond milk with added calcium and vitamin D. Soy cheese and yogurt. Beverages Carbonated drinks. Tea. Coffee, freeze-dried coffee, and some instant coffees. Fruit and vegetable juices. Condiments Soy sauce. Carob powder. Olives. Gravy made with water. Baker's cocoa. Angie Fava. Pure seasonings and spices. Ketchup. Mustard. Bouillon. Broth. Sweets and Desserts Water and fruit ices. Gelatin. Cookies, pies, or cakes made from allowed ingredients, such as angel food cake. Pudding made with water or a milk substitute. Lactose-free tofu desserts. Soy, coconut milk, or rice-milk-based frozen desserts. Sugar. Honey. Jam, jelly, and marmalade. Molasses. Pure sugar candy. Dark chocolate without  milk. Marshmallows. Fats and Oils Margarines and salad dressings that do not contain milk. Berniece Salines. Vegetable oils. Shortening. Mayonnaise. Soy or coconut-based cream. The items listed above may not  be a complete list of recommended foods or beverages. Contact your dietitian for more options. Which foods are not recommended? Grains Breads and rolls that contain milk. Toaster pastries. Muffins, biscuits, waffles, cornbread, and pancakes. These can be prepared at home, commercial, or from mixes. Sweet rolls, donuts, English muffins, fry bread, lefse, flour tortillas with lactose, or [place] toast made with milk or milk ingredients. Crackers that contain lactose. Corn curls. Cooked or dry cereals with lactose. Vegetables Creamed or breaded vegetables. Vegetables in a cheese or butter sauce or with lactose-containing margarines. Instant potatoes. [place] fries. Scalloped or au gratin potatoes. Fruits None. Meats and Other Protein Sources Scrambled eggs, omelets, and souffles that contain milk. Creamed or breaded meat, fish, chicken, or [place]. Sausage products, such as wieners and liver sausage. Cold cuts that contain milk solids. Cheese, cottage cheese, ricotta cheese, and cheese spreads. Lasagna and macaroni and cheese. Pizza. Peanut or other nut butters with added milk solids. Casseroles or mixed dishes containing milk or cheese. Dairy All dairy products, including milk, goat's milk, buttermilk, kefir, acidophilus milk, flavored milk, evaporated milk, condensed milk, dulce de Vici, eggnog, yogurt, cheese, and cheese spreads. Beverages Hot chocolate. Cocoa with lactose. Instant iced teas. Powdered fruit drinks. Smoothies made with milk or yogurt. Condiments Chewing gum that has lactose. Cocoa that has lactose. Spice blends if they contain milk products. Artificial sweeteners that contain lactose. Nondairy creamers. Sweets and Desserts Ice cream, ice milk, gelato, sherbet, and frozen yogurt. Custard, pudding, and mousse. Cake, cream pies, cookies, and other desserts containing milk, cream, cream cheese, or milk chocolate. Pie crust made with milk-containing margarine or butter. Reduced-calorie  desserts made with a sugar substitute that contains lactose. Toffee and butterscotch. Milk, white, or dark chocolate that contains milk. Fudge. Caramel. Fats and Oils Margarines and salad dressings that contain milk or cheese. Cream. Half and half. Cream cheese. Sour cream. Chip dips made with sour cream or yogurt. The items listed above may not be a complete list of foods and beverages to avoid. Contact your dietitian for more information. Am I getting enough calcium? Calcium is found in many foods that contain lactose and is important for bone health. The amount of calcium you need depends on your age:  Adults younger than 50 years: 1000 mg of calcium a day.  Adults older than 50 years: 1200 mg of calcium a day.  If you are not getting enough calcium, other calcium sources include:  Orange juice with calcium added. There are 300-350 mg of calcium in 1 cup of orange juice.  Sardines with edible bones. There are 325 mg of calcium in 3 oz of sardines.  Calcium-fortified soy milk. There are 300-400 mg of calcium in 1 cup of calcium-fortified soy milk.  Calcium-fortified rice or almond milk. There are 300 mg of calcium in 1 cup of calcium-fortified rice or almond milk.  Canned salmon with edible bones. There are 180 mg of calcium in 3 oz of canned salmon with edible bones.  Calcium-fortified breakfast cereals. There are [phone] mg of calcium in calcium-fortified breakfast cereals.  Tofu set with calcium sulfate. There are 250 mg of calcium in  cup of tofu set with calcium sulfate.  Spinach, cooked. There are 145 mg of calcium in  cup of cooked spinach.  Edamame, cooked. There are 130 mg of  calcium in  cup of cooked edamame.  Collard greens, cooked. There are 125 mg of calcium in  cup of cooked collard greens.  Kale, frozen or cooked. There are 90 mg of calcium in  cup of cooked or frozen kale.  Almonds. There are 95 mg of calcium in  cup of almonds.  Broccoli, cooked. There  are 60 mg of calcium in 1 cup of cooked broccoli.  This information is not intended to replace advice given to you by your health care provider. Make sure you discuss any questions you have with your health care provider. Document Released: 12/30/2001 Document Revised: 12/16/2015 Document Reviewed: 10/10/2013 Elsevier Interactive Patient Education  2018 [place] Fissure, Adult An anal fissure is a small tear or crack in the skin around the opening of the butt (anus).Bleeding from the tear or crack usually stops on its own within a few minutes. The bleeding may happen every time you poop (have a bowel movement) until the tear or crack heals. Follow these instructions at home: Eating and drinking  Avoid bananas and dairy products. These foods can make it hard to poop.  Drink enough fluid to keep your pee (urine) clear or pale yellow.  Eat a lot of fruit, whole grains, and vegetables. General instructions  Keep the butt area as clean and dry as you can.  Take a warm water bath (sitz bath) as told by your doctor. Do not use soap.  Take over-the-counter and prescription medicines only as told by your doctor.  Use creams or ointments only as told by your doctor.  Keep all follow-up visits as told by your doctor. This is important. Contact a doctor if:  You have more bleeding.  You have a fever.  You have watery poop (diarrhea) that is mixed with blood.  You have pain.  You problem gets worse, not better. This information is not intended to replace advice given to you by your health care provider. Make sure you discuss any questions you have with your health care provider. Document Released: 03/08/2011 Document Revised: 12/16/2015 Document Reviewed: 10/05/2014 Elsevier Interactive Patient Education  2018 [place] 1 tablespoon 2-3 times daily

## 2017-11-20 NOTE — Progress Notes (Signed)
Kellie Mason    [id number]    1961-03-29  Primary Care Physician:Greene, Ranell Patrick, MD  Referring Physician: Wendie Agreste, MD [address] [place], [place] [address]  Chief complaint: Hemorrhoids  HPI: 58 year old female with history of irritable bowel syndrome, chronic constipation alternating with diarrhea and symptomatic hemorrhoids here requesting hemorrhoidal band ligation.  She has intermittent bright red blood per rectum, small volume when she wipes.  Patient also has intermittent rectal discomfort when she sits for prolonged period of time and sometimes after a bowel movement. Colonoscopy August 2018 showed sigmoid diverticulosis and internal hemorrhoids otherwise unremarkable exam Denies any vomiting, abdominal pain, dysphagia, or melena.  Outpatient Encounter Medications as of 11/20/2017  Medication Sig  . amLODipine (NORVASC) 2.5 MG tablet Take 1 tablet (2.5 mg total) by mouth daily.  . Biotin (BIOTIN 5000) 5 MG CAPS Take 1 capsule by mouth daily.  . Calcium Carbonate-Vit D-Min (CALTRATE PLUS PO) Take by mouth.    . clobetasol cream (TEMOVATE) 6.96 % Apply 1 application topically 2 (two) times daily as needed.  Marland Kitchen estradiol (ESTRACE) 0.5 MG tablet Take 0.5 mg by mouth daily.  Marland Kitchen gabapentin (NEURONTIN) 300 MG capsule In the evenings can 1-3 tablets before bed.  . hydrochlorothiazide (MICROZIDE) 12.5 MG capsule Take 1 capsule (12.5 mg total) by mouth every morning.  . hydrocortisone (ANUSOL-HC) 25 MG suppository Place 1 suppository (25 mg total) rectally at bedtime.  . Progesterone Micronized (PROGESTERONE PO) Take by mouth.  . vitamin B-12 (CYANOCOBALAMIN) 1000 MCG tablet Take 1,000 mcg by mouth daily.  . AMBULATORY NON FORMULARY MEDICATION Medication Name:  Nitroglycerin ointment 0.125% Use pea sized amount per rectum three times a day  X 6-8 weeks  . colestipol (COLESTID) 1 g tablet Take 1 tablet (1 g total) by mouth 2 (two) times daily.  . [DISCONTINUED]  Wheat Dextrin (BENEFIBER) POWD Take 1 tablespoon three times a day with meals   No facility-administered encounter medications on file as of 11/20/2017.     Allergies as of 11/20/2017 - Review Complete 11/20/2017  Allergen Reaction Noted  . Tetracyclines & related  12/23/2010  . Codeine Rash 09/27/2011    Past Medical History:  Diagnosis Date  . Arthritis   . Basal cell carcinoma of anterior chest    shave bx - 08/12/12 - Dr. Wilhemina Bonito.  . Chest pain   . Cough   . Dizziness - light-headed   . Headache   . Hypertension   . Palpitations   . Tobacco abuse    No ready to quit    Past Surgical History:  Procedure Laterality Date  . ELECTROCARDIOGRAM    . KNEE ARTHROSCOPY Left     Family History  Problem Relation Age of Onset  . Hypertension Mother   . Cancer Mother   . Neuropathy Mother   . Hypertension Sister   . Cancer Father   . Hypertension Maternal Grandfather   . Neuropathy Brother     Social History   Socioeconomic History  . Marital status: Married    Spouse name: Not on file  . Number of children: Not on file  . Years of education: Not on file  . Highest education level: Not on file  Occupational History  . Occupation: Works for Education officer, community    Comment: Health and safety inspector  Social Needs  . Financial resource strain: Not on file  . Food insecurity:    Worry: Not on file  Inability: Not on file  . Transportation needs:    Medical: Not on file    Non-medical: Not on file  Tobacco Use  . Smoking status: Current Every Day Smoker    Packs/day: 0.50  . Smokeless tobacco: Never Used  Substance and Sexual Activity  . Alcohol use: Yes    Alcohol/week: 1.2 oz    Types: 2 Standard drinks or equivalent per week  . Drug use: No  . Sexual activity: Yes  Lifestyle  . Physical activity:    Days per week: Not on file    Minutes per session: Not on file  . Stress: Not on file  Relationships  . Social connections:    Talks on phone: Not on file     Gets together: Not on file    Attends religious service: Not on file    Active member of club or organization: Not on file    Attends meetings of clubs or organizations: Not on file    Relationship status: Not on file  . Intimate partner violence:    Fear of current or ex partner: Not on file    Emotionally abused: Not on file    Physically abused: Not on file    Forced sexual activity: Not on file  Other Topics Concern  . Not on file  Social History Narrative   Married. Exercises 5 days per week...speed walking, 3-4 miles      Review of systems: Review of Systems  Constitutional: Negative for fever and chills.  HENT: Negative.   Eyes: Negative for blurred vision.  Respiratory: Negative for cough, shortness of breath and wheezing.   Cardiovascular: Negative for chest pain and palpitations.  Gastrointestinal: as per HPI Genitourinary: Negative for dysuria, urgency, frequency and hematuria.  Musculoskeletal: Negative for myalgias, back pain and joint pain.  Skin: Negative for itching and rash.  Neurological: Negative for dizziness, tremors, focal weakness, seizures and loss of consciousness.  Endo/Heme/Allergies: Negative for seasonal allergies.  Psychiatric/Behavioral: Negative for depression, suicidal ideas and hallucinations.  All other systems reviewed and are negative.   Physical Exam: Vitals:   11/20/17 1536  BP: 126/78  Pulse: 64   Body mass index is 19.14 kg/m. Gen:      No acute distress HEENT:  EOMI, sclera anicteric Neck:     No masses; no thyromegaly Lungs:    Clear to auscultation bilaterally; normal respiratory effort CV:         Regular rate and rhythm; no murmurs Abd:      + bowel sounds; soft, non-tender; no palpable masses, no distension Ext:    No edema; adequate peripheral perfusion Skin:      Warm and dry; no rash Neuro: alert and oriented x 3 Psych: normal mood and affect Rectal exam: Increased anal sphincter tone and mild tenderness, no  external hemorrhoids Anoscopy was attempted but patient did not tolerate complete insertion of the scope Data Reviewed:  Reviewed labs, radiology imaging, old records and pertinent past GI work up   Assessment and Plan/Recommendations: 57 year old female with history of irritable bowel syndrome, chronic constipation alternating with diarrhea and symptomatic internal hemorrhoids here requesting hemorrhoidal band ligation And had significant anal discomfort with a rectal exam and patient did not tolerate anoscope Will defer hemorrhoidal band ligation Rectal nitroglycerin 0.125%, 3 times daily small pea-sized amount per rectum for next 4 to 6 weeks for management of possible anal fissure Colestid 1 g twice daily to improve diarrhea, possible bile salt induced diarrhea Use Anusol  suppository at bedtime as needed Increase dietary fluid and fiber intake Return in 2 months  Greater than 50% of the time used for counseling as well as treatment plan and follow-up. She had multiple questions which were answered to her satisfaction  K. Denzil Magnuson , MD [phone]    CC: Wendie Agreste, MD

## 2017-11-26 ENCOUNTER — Encounter: Payer: Self-pay | Admitting: Gastroenterology

## 2017-12-06 DIAGNOSIS — C44529 Squamous cell carcinoma of skin of other part of trunk: Secondary | ICD-10-CM | POA: Diagnosis not present

## 2018-02-21 ENCOUNTER — Ambulatory Visit: Payer: BLUE CROSS/BLUE SHIELD | Admitting: Gastroenterology

## 2018-03-01 ENCOUNTER — Ambulatory Visit: Payer: Self-pay

## 2018-03-01 NOTE — Telephone Encounter (Signed)
Patient called in with c/o "dizziness." She says "last week I have been having problems with my ears and they got better Wednesday. About 30 minutes ago, I was sitting and I got dizzy. I reached over my left arm to grab some water and felt like I was going to tilt over to the left on the floor. I looked outside and my vision was different, like I was in a slow motion movie. My ears are ringing and I feel pressure in them. I feel like if I get up, I will fall, that's how bad it is. It's worse when I move my head." According to protocol, see PCP within 24 hours, no availability with PCP, appointment scheduled for tomorrow at 1500 with Juanda Crumble, PA-C, care advice given, patient verbalized understanding.   Reason for Disposition . [1] MODERATE dizziness (e.g., vertigo; feels very unsteady, interferes with normal activities) AND [2] has NOT been evaluated by physician for this  Answer Assessment - Initial Assessment Questions 1. DESCRIPTION: "Describe your dizziness."     Off balance 2. VERTIGO: "Do you feel like either you or the room is spinning or tilting?"      Yes 3. LIGHTHEADED: "Do you feel lightheaded?" (e.g., somewhat faint, woozy, weak upon standing)     Yes 4. SEVERITY: "How bad is it?"  "Can you walk?"   - MILD - Feels unsteady but walking normally.   - MODERATE - Feels very unsteady when walking, but not falling; interferes with normal activities (e.g., school, work) .   - SEVERE - Unable to walk without falling (requires assistance).     Moderate 5. ONSET:  "When did the dizziness begin?"     30-40 minutes ago 6. AGGRAVATING FACTORS: "Does anything make it worse?" (e.g., standing, change in head position)     Changing in head position 7. CAUSE: "What do you think is causing the dizziness?"     I don't know 8. RECURRENT SYMPTOM: "Have you had dizziness before?" If so, ask: "When was the last time?" "What happened that time?"     Yes, but not this severe 9. OTHER SYMPTOMS: "Do  you have any other symptoms?" (e.g., headache, weakness, numbness, vomiting, earache)     Ringing in ears, pressure in ears, vision changes 10. PREGNANCY: "Is there any chance you are pregnant?" "When was your last menstrual period?"       No  Protocols used: DIZZINESS - VERTIGO-A-AH

## 2018-03-02 ENCOUNTER — Ambulatory Visit: Payer: Self-pay | Admitting: Physician Assistant

## 2018-03-04 ENCOUNTER — Other Ambulatory Visit: Payer: Self-pay

## 2018-03-04 ENCOUNTER — Encounter: Payer: Self-pay | Admitting: Family Medicine

## 2018-03-04 ENCOUNTER — Ambulatory Visit: Payer: BLUE CROSS/BLUE SHIELD | Admitting: Family Medicine

## 2018-03-04 VITALS — BP 142/72 | HR 86 | Temp 98.1°F | Ht 65.0 in | Wt 115.0 lb

## 2018-03-04 DIAGNOSIS — G629 Polyneuropathy, unspecified: Secondary | ICD-10-CM

## 2018-03-04 DIAGNOSIS — I10 Essential (primary) hypertension: Secondary | ICD-10-CM

## 2018-03-04 DIAGNOSIS — E538 Deficiency of other specified B group vitamins: Secondary | ICD-10-CM | POA: Diagnosis not present

## 2018-03-04 DIAGNOSIS — H9311 Tinnitus, right ear: Secondary | ICD-10-CM

## 2018-03-04 DIAGNOSIS — H938X1 Other specified disorders of right ear: Secondary | ICD-10-CM | POA: Diagnosis not present

## 2018-03-04 MED ORDER — GABAPENTIN 300 MG PO CAPS: 300.0000 mg | ORAL_CAPSULE | Freq: Every day | ORAL | 1 refills | Status: DC

## 2018-03-04 NOTE — Progress Notes (Signed)
Subjective:  By signing my name below, I, Kellie Mason, attest that this documentation has been prepared under the direction and in the presence of Wendie Agreste, MD Electronically Signed: Ladene Artist, ED Scribe 03/04/2018 at 10:21 AM.   Patient ID: Kellie Mason, female    DOB: June 29, 1961, 57 y.o.   MRN: [id number]  Chief Complaint  Patient presents with  . Blood Pressure Check    6 month f/u  . right ear pressure and humming noise    3 weeks   HPI Kellie Mason is a 57 y.o. female who presents to Primary Care at [facility] for f/u. New concern of R ear pressure and humming noise. Pt is fasting at this visit.  HTN Lab Results  Component Value Date   CREATININE 0.73 06/18/2017   BP Readings from Last 3 Encounters:  03/04/18 (!) 142/72  11/20/17 126/78  06/18/17 124/72  Norvasc 2.5 mg qd, HCTZ 12.5 mg qd. - She missed her meds yesterday.  Peripheral Neuropathy with B12 Deficiency Had B12 replacement prev. Takes gabapentin 300 mg 1-3 qhs prn. - Pt states she is taking 3 100 mg tabs of gabapentin which she states helps with nighttime cramping but not tingling, which has gradually worsened. Reports symptoms are worse with sitting, improved with walking. Pt is taking Vit B12 1000 mcg once/day.  Tobacco Abuse 1/2 ppd. Cessation dicussed prev but wasn't ready to quit. - Pt is not ready to quit yet.  R Ear Pressure with Humming Noise Pt reports symptoms  x 3 wks. Telephone note from 3 days ago reviewed. Dizziness noted at that time along with ears ringing and pressure, worse with head movement. - Pt states symptoms didn't occur yesterday but returned last night while laying down. States symptoms were worse 3 days ago while leaning to one side in her recliner to watch her dog outside when she noticed sudden onset of dizziness and double vision that lasted for 3.5 hrs. States symptoms self-resolved. She is still having constant R ear ringing and pressure which she has had for a few  wks. Also reports recent R nasal congestion, sneezing, rhinorrhea. Denies ear pain, weakness in extremities, HA, LOC.  Patient Active Problem List   Diagnosis Date Noted  . Vitamin B12 deficiency neuropathy ([place]) 08/08/2016  . Neuropathy 01/04/2016  . History of basal cell carcinoma 11/18/2012  . Atopic dermatitis 04/12/2012  . Hypertension 02/06/2011  . Tobacco use disorder 02/06/2011  . Healthcare maintenance 02/06/2011   Past Medical History:  Diagnosis Date  . Arthritis   . Basal cell carcinoma of anterior chest    shave bx - 08/12/12 - Dr. Wilhemina Bonito.  . Chest pain   . Cough   . Dizziness - light-headed   . Headache   . Hypertension   . Palpitations   . Tobacco abuse    No ready to quit   Past Surgical History:  Procedure Laterality Date  . ELECTROCARDIOGRAM    . KNEE ARTHROSCOPY Left    Allergies  Allergen Reactions  . Tetracyclines & Related   . Codeine Rash   Prior to Admission medications   Medication Sig Start Date End Date Taking? Authorizing Provider  AMBULATORY NON FORMULARY MEDICATION Medication Name:  Nitroglycerin ointment 0.125% Use pea sized amount per rectum three times a day  X 6-8 weeks 11/20/17   Mauri Pole, MD  amLODipine (NORVASC) 2.5 MG tablet Take 1 tablet (2.5 mg total) by mouth daily. 04/03/17   Melvenia Beam,  MD  Biotin (BIOTIN 5000) 5 MG CAPS Take 1 capsule by mouth daily.    [provider]  Calcium Carbonate-Vit D-Min (CALTRATE PLUS PO) Take by mouth.      [provider]  clobetasol cream (TEMOVATE) 1.74 % Apply 1 application topically 2 (two) times daily as needed. 06/18/17   Wendie Agreste, MD  colestipol (COLESTID) 1 g tablet Take 1 tablet (1 g total) by mouth 2 (two) times daily. 11/20/17   Mauri Pole, MD  estradiol (ESTRACE) 0.5 MG tablet Take 0.5 mg by mouth daily.    [provider]  gabapentin (NEURONTIN) 300 MG capsule In the evenings can 1-3 tablets before bed. 04/03/17   Melvenia Beam, MD  hydrochlorothiazide (MICROZIDE) 12.5 MG capsule Take 1 capsule (12.5 mg total) by mouth every morning. 04/03/17   Melvenia Beam, MD  hydrocortisone (ANUSOL-HC) 25 MG suppository Place 1 suppository (25 mg total) rectally at bedtime. 10/02/17   Mauri Pole, MD  Progesterone Micronized (PROGESTERONE PO) Take by mouth.    [provider]  vitamin B-12 (CYANOCOBALAMIN) 1000 MCG tablet Take 1,000 mcg by mouth daily.    [provider]   Social History   Socioeconomic History  . Marital status: Married    Spouse name: Not on file  . Number of children: Not on file  . Years of education: Not on file  . Highest education level: Not on file  Occupational History  . Occupation: Works for Education officer, community    Comment: Health and safety inspector  Social Needs  . Financial resource strain: Not on file  . Food insecurity:    Worry: Not on file    Inability: Not on file  . Transportation needs:    Medical: Not on file    Non-medical: Not on file  Tobacco Use  . Smoking status: Current Every Day Smoker    Packs/day: 0.50  . Smokeless tobacco: Never Used  Substance and Sexual Activity  . Alcohol use: Yes    Alcohol/week: 2.0 standard drinks    Types: 2 Standard drinks or equivalent per week  . Drug use: No  . Sexual activity: Yes  Lifestyle  . Physical activity:    Days per week: Not on file    Minutes per session: Not on file  . Stress: Not on file  Relationships  . Social connections:    Talks on phone: Not on file    Gets together: Not on file    Attends religious service: Not on file    Active member of club or organization: Not on file    Attends meetings of clubs or organizations: Not on file    Relationship status: Not on file  . Intimate partner violence:    Fear of current or ex partner: Not on file    Emotionally abused: Not on file    Physically abused: Not on file    Forced sexual activity: Not on file  Other Topics Concern  . Not on  file  Social History Narrative   Married. Exercises 5 days per week...speed walking, 3-4 miles   Review of Systems  HENT: Positive for congestion, rhinorrhea, sneezing and tinnitus (ringing/humming). Negative for ear pain.   Eyes: Positive for visual disturbance (resolved).  Neurological: Positive for dizziness (resolved). Negative for weakness and headaches.      Objective:   Physical Exam  Constitutional: She is oriented to person, place, and time. She appears well-developed and well-nourished. No distress.  HENT:  Head: Normocephalic and atraumatic.  Right Ear: Hearing, tympanic membrane, external ear and ear canal normal.  Left Ear: Hearing, tympanic membrane, external ear and ear canal normal.  Nose: Nose normal.  Mouth/Throat: Oropharynx is clear and moist. No oropharyngeal exudate.  Ear: Unable to completley visualize R TM due to small amount of cerumen.  Eyes: Pupils are equal, round, and reactive to light. Conjunctivae and EOM are normal. Right eye exhibits no nystagmus. Left eye exhibits no nystagmus.  Neck: Carotid bruit is not present.  Cardiovascular: Normal rate, regular rhythm, normal heart sounds and intact distal pulses.  No murmur heard. Pulmonary/Chest: Effort normal and breath sounds normal. No respiratory distress. She has no wheezes. She has no rhonchi.  Neurological: She is alert and oriented to person, place, and time. She displays a negative Romberg sign.  No pronator drift.  Skin: Skin is warm and dry. No rash noted.  Psychiatric: She has a normal mood and affect. Her behavior is normal.  Vitals reviewed.  Vitals:   03/04/18 0956 03/04/18 0959  BP: (!) 146/82 (!) 142/72  Pulse: 86   Temp: 98.1 F (36.7 C)   TempSrc: Oral   SpO2: 96%   Weight: 115 lb (52.2 kg)   Height: 5\' 5"  (1.651 m)       Assessment & Plan:   Kellie Mason is a 57 y.o. female B12 deficiency - Plan: Vitamin B12 Neuropathy - Plan: gabapentin (NEURONTIN) 300 MG capsule  -  continue gabapentin, repeat B12 level as some increased neuropathic symptoms.  Recommended follow-up with neurology for worsening neuropathy symptoms  Essential hypertension - Plan: Comprehensive metabolic panel, Lipid panel  -Check labs, tolerating current dose of medications, current elevated reading may be due to missed dose 1 day prior.  Monitor home readings, and if remaining over 140/90, will need to adjust regimen.   Ear pressure, right Tinnitus of right ear  -Prior symptoms of possible vertigo, with temporary vision symptoms as above, but there is resolved.  Nonfocal neurologic exam at present.  May be due to middle ear pressure with congestion symptoms as above.  Overall has had some improvement.   -Flonase nasal spray daily, 1 to 2 days of Afrin if needed but recheck within 1 week if symptoms do not improve.  Sooner if worse and RTC/ER precautions if acute worsening including any return of dizziness or vision symptoms.   Meds ordered this encounter  Medications  . gabapentin (NEURONTIN) 300 MG capsule    Sig: Take 1 capsule (300 mg total) by mouth at bedtime.    Dispense:  90 capsule    Refill:  1   Patient Instructions   Blood pressure running a little high today, may be due in part to missed dose yesterday.  Keep a record of your blood pressures outside of the office and running over 140/90 - let me know as we may need to change meds.   Try Flonase nasal spray every day for congestion, Afrin 1-2 times as needed for congestion.  If the ringing in the ears and pressure in ears does not resolve within 1 week, or any worsening symptoms return for recheck.  If you have any return of dizziness, or vision changes, be seen in the emergency room right away.  I will check a B12 level, but if you are having worse tingling in feet I would recommend meeting with Dr. Jaynee Eagles. Continue gabapentin and B12 at same doses for now.    Steps to Quit Smoking Smoking tobacco  can be harmful to your  health and can affect almost every organ in your body. Smoking puts you, and those around you, at risk for developing many serious chronic diseases. Quitting smoking is difficult, but it is one of the best things that you can do for your health. It is never too late to quit. What are the benefits of quitting smoking? When you quit smoking, you lower your risk of developing serious diseases and conditions, such as:  Lung cancer or lung disease, such as COPD.  Heart disease.  Stroke.  Heart attack.  Infertility.  Osteoporosis and bone fractures.  Additionally, symptoms such as coughing, wheezing, and shortness of breath may get better when you quit. You may also find that you get sick less often because your body is stronger at fighting off colds and infections. If you are pregnant, quitting smoking can help to reduce your chances of having a baby of low birth weight. How do I get ready to quit? When you decide to quit smoking, create a plan to make sure that you are successful. Before you quit:  Pick a date to quit. Set a date within the next two weeks to give you time to prepare.  Write down the reasons why you are quitting. Keep this list in places where you will see it often, such as on your bathroom mirror or in your car or wallet.  Identify the people, places, things, and activities that make you want to smoke (triggers) and avoid them. Make sure to take these actions: ? Throw away all cigarettes at home, at work, and in your car. ? Throw away smoking accessories, such as Scientist, research (medical). ? Clean your car and make sure to empty the ashtray. ? Clean your home, including curtains and carpets.  Tell your family, friends, and coworkers that you are quitting. Support from your loved ones can make quitting easier.  Talk with your health care provider about your options for quitting smoking.  Find out what treatment options are covered by your health insurance.  What strategies  can I use to quit smoking? Talk with your healthcare provider about different strategies to quit smoking. Some strategies include:  Quitting smoking altogether instead of gradually lessening how much you smoke over a period of time. Research shows that quitting "cold [place]" is more successful than gradually quitting.  Attending in-person counseling to help you build problem-solving skills. You are more likely to have success in quitting if you attend several counseling sessions. Even short sessions of 10 minutes can be effective.  Finding resources and support systems that can help you to quit smoking and remain smoke-free after you quit. These resources are most helpful when you use them often. They can include: ? Online chats with a Social worker. ? Telephone quitlines. ? Careers information officer. ? Support groups or group counseling. ? Text messaging programs. ? Mobile phone applications.  Taking medicines to help you quit smoking. (If you are pregnant or breastfeeding, talk with your health care provider first.) Some medicines contain nicotine and some do not. Both types of medicines help with cravings, but the medicines that include nicotine help to relieve withdrawal symptoms. Your health care provider may recommend: ? Nicotine patches, gum, or lozenges. ? Nicotine inhalers or sprays. ? Non-nicotine medicine that is taken by mouth.  Talk with your health care provider about combining strategies, such as taking medicines while you are also receiving in-person counseling. Using these two strategies together makes you more likely to  succeed in quitting than if you used either strategy on its own. If you are pregnant or breastfeeding, talk with your health care provider about finding counseling or other support strategies to quit smoking. Do not take medicine to help you quit smoking unless told to do so by your health care provider. What things can I do to make it easier to quit? Quitting  smoking might feel overwhelming at first, but there is a lot that you can do to make it easier. Take these important actions:  Reach out to your family and friends and ask that they support and encourage you during this time. Call telephone quitlines, reach out to support groups, or work with a counselor for support.  Ask people who smoke to avoid smoking around you.  Avoid places that trigger you to smoke, such as bars, parties, or smoke-break areas at work.  Spend time around people who do not smoke.  Lessen stress in your life, because stress can be a smoking trigger for some people. To lessen stress, try: ? Exercising regularly. ? Deep-breathing exercises. ? Yoga. ? Meditating. ? Performing a body scan. This involves closing your eyes, scanning your body from head to toe, and noticing which parts of your body are particularly tense. Purposefully relax the muscles in those areas.  Download or purchase mobile phone or tablet apps (applications) that can help you stick to your quit plan by providing reminders, tips, and encouragement. There are many free apps, such as QuitGuide from the [employer] Office manager for Disease Control and Prevention). You can find other support for quitting smoking (smoking cessation) through smokefree.gov and other websites.  How will I feel when I quit smoking? Within the first 24 hours of quitting smoking, you may start to feel some withdrawal symptoms. These symptoms are usually most noticeable 2-3 days after quitting, but they usually do not last beyond 2-3 weeks. Changes or symptoms that you might experience include:  Mood swings.  Restlessness, anxiety, or irritation.  Difficulty concentrating.  Dizziness.  Strong cravings for sugary foods in addition to nicotine.  Mild weight gain.  Constipation.  Nausea.  Coughing or a sore throat.  Changes in how your medicines work in your body.  A depressed mood.  Difficulty sleeping (insomnia).  After the  first 2-3 weeks of quitting, you may start to notice more positive results, such as:  Improved sense of smell and taste.  Decreased coughing and sore throat.  Slower heart rate.  Lower blood pressure.  Clearer skin.  The ability to breathe more easily.  Fewer sick days.  Quitting smoking is very challenging for most people. Do not get discouraged if you are not successful the first time. Some people need to make many attempts to quit before they achieve long-term success. Do your best to stick to your quit plan, and talk with your health care provider if you have any questions or concerns. This information is not intended to replace advice given to you by your health care provider. Make sure you discuss any questions you have with your health care provider. Document Released: 07/04/2001 Document Revised: 03/07/2016 Document Reviewed: 11/24/2014 Elsevier Interactive Patient Education  2018 [employer].   IF you received an x-ray today, you will receive an invoice from [facility]. Please contact [facility] at [phone] with questions or concerns regarding your invoice.   IF you received labwork today, you will receive an invoice from [place]. Please contact [facility] at [phone] with questions or concerns regarding your invoice.  Our billing staff will not be able to assist you with questions regarding bills from these companies.  You will be contacted with the lab results as soon as they are available. The fastest way to get your results is to activate your My Chart account. Instructions are located on the last page of this paperwork. If you have not heard from [place] regarding the results in 2 weeks, please contact this office.       I personally performed the services described in this documentation, which was scribed in my presence. The recorded information has been reviewed and considered for accuracy and completeness, addended by me as needed, and agree  with information above.  Signed,   [name], MD [facility] at [place].  03/06/18 4:47 PM

## 2018-03-04 NOTE — Patient Instructions (Addendum)
Blood pressure running a little high today, may be due in part to missed dose yesterday.  Keep a record of your blood pressures outside of the office and running over 140/90 - let me know as we may need to change meds.   Try Flonase nasal spray every day for congestion, Afrin 1-2 times as needed for congestion.  If the ringing in the ears and pressure in ears does not resolve within 1 week, or any worsening symptoms return for recheck.  If you have any return of dizziness, or vision changes, be seen in the emergency room right away.  I will check a B12 level, but if you are having worse tingling in feet I would recommend meeting with Dr. Jaynee Eagles. Continue gabapentin and B12 at same doses for now.    Steps to Quit Smoking Smoking tobacco can be harmful to your health and can affect almost every organ in your body. Smoking puts you, and those around you, at risk for developing many serious chronic diseases. Quitting smoking is difficult, but it is one of the best things that you can do for your health. It is never too late to quit. What are the benefits of quitting smoking? When you quit smoking, you lower your risk of developing serious diseases and conditions, such as:  Lung cancer or lung disease, such as COPD.  Heart disease.  Stroke.  Heart attack.  Infertility.  Osteoporosis and bone fractures.  Additionally, symptoms such as coughing, wheezing, and shortness of breath may get better when you quit. You may also find that you get sick less often because your body is stronger at fighting off colds and infections. If you are pregnant, quitting smoking can help to reduce your chances of having a baby of low birth weight. How do I get ready to quit? When you decide to quit smoking, create a plan to make sure that you are successful. Before you quit:  Pick a date to quit. Set a date within the next two weeks to give you time to prepare.  Write down the reasons why you are quitting. Keep  this list in places where you will see it often, such as on your bathroom mirror or in your car or wallet.  Identify the people, places, things, and activities that make you want to smoke (triggers) and avoid them. Make sure to take these actions: ? Throw away all cigarettes at home, at work, and in your car. ? Throw away smoking accessories, such as Scientist, research (medical). ? Clean your car and make sure to empty the ashtray. ? Clean your home, including curtains and carpets.  Tell your family, friends, and coworkers that you are quitting. Support from your loved ones can make quitting easier.  Talk with your health care provider about your options for quitting smoking.  Find out what treatment options are covered by your health insurance.  What strategies can I use to quit smoking? Talk with your healthcare provider about different strategies to quit smoking. Some strategies include:  Quitting smoking altogether instead of gradually lessening how much you smoke over a period of time. Research shows that quitting "cold [place]" is more successful than gradually quitting.  Attending in-person counseling to help you build problem-solving skills. You are more likely to have success in quitting if you attend several counseling sessions. Even short sessions of 10 minutes can be effective.  Finding resources and support systems that can help you to quit smoking and remain smoke-free after you quit.  These resources are most helpful when you use them often. They can include: ? Online chats with a Social worker. ? Telephone quitlines. ? Careers information officer. ? Support groups or group counseling. ? Text messaging programs. ? Mobile phone applications.  Taking medicines to help you quit smoking. (If you are pregnant or breastfeeding, talk with your health care provider first.) Some medicines contain nicotine and some do not. Both types of medicines help with cravings, but the medicines that  include nicotine help to relieve withdrawal symptoms. Your health care provider may recommend: ? Nicotine patches, gum, or lozenges. ? Nicotine inhalers or sprays. ? Non-nicotine medicine that is taken by mouth.  Talk with your health care provider about combining strategies, such as taking medicines while you are also receiving in-person counseling. Using these two strategies together makes you more likely to succeed in quitting than if you used either strategy on its own. If you are pregnant or breastfeeding, talk with your health care provider about finding counseling or other support strategies to quit smoking. Do not take medicine to help you quit smoking unless told to do so by your health care provider. What things can I do to make it easier to quit? Quitting smoking might feel overwhelming at first, but there is a lot that you can do to make it easier. Take these important actions:  Reach out to your family and friends and ask that they support and encourage you during this time. Call telephone quitlines, reach out to support groups, or work with a counselor for support.  Ask people who smoke to avoid smoking around you.  Avoid places that trigger you to smoke, such as bars, parties, or smoke-break areas at work.  Spend time around people who do not smoke.  Lessen stress in your life, because stress can be a smoking trigger for some people. To lessen stress, try: ? Exercising regularly. ? Deep-breathing exercises. ? Yoga. ? Meditating. ? Performing a body scan. This involves closing your eyes, scanning your body from head to toe, and noticing which parts of your body are particularly tense. Purposefully relax the muscles in those areas.  Download or purchase mobile phone or tablet apps (applications) that can help you stick to your quit plan by providing reminders, tips, and encouragement. There are many free apps, such as QuitGuide from the [employer] Office manager for Disease Control and  Prevention). You can find other support for quitting smoking (smoking cessation) through smokefree.gov and other websites.  How will I feel when I quit smoking? Within the first 24 hours of quitting smoking, you may start to feel some withdrawal symptoms. These symptoms are usually most noticeable 2-3 days after quitting, but they usually do not last beyond 2-3 weeks. Changes or symptoms that you might experience include:  Mood swings.  Restlessness, anxiety, or irritation.  Difficulty concentrating.  Dizziness.  Strong cravings for sugary foods in addition to nicotine.  Mild weight gain.  Constipation.  Nausea.  Coughing or a sore throat.  Changes in how your medicines work in your body.  A depressed mood.  Difficulty sleeping (insomnia).  After the first 2-3 weeks of quitting, you may start to notice more positive results, such as:  Improved sense of smell and taste.  Decreased coughing and sore throat.  Slower heart rate.  Lower blood pressure.  Clearer skin.  The ability to breathe more easily.  Fewer sick days.  Quitting smoking is very challenging for most people. Do not get discouraged if you are  not successful the first time. Some people need to make many attempts to quit before they achieve long-term success. Do your best to stick to your quit plan, and talk with your health care provider if you have any questions or concerns. This information is not intended to replace advice given to you by your health care provider. Make sure you discuss any questions you have with your health care provider. Document Released: 07/04/2001 Document Revised: 03/07/2016 Document Reviewed: 11/24/2014 Elsevier Interactive Patient Education  2018 [employer].   IF you received an x-ray today, you will receive an invoice from [facility]. Please contact [facility] at [phone] with questions or concerns regarding your invoice.   IF you received labwork  today, you will receive an invoice from [facility]. Please contact [facility] at [phone] with questions or concerns regarding your invoice.   Our billing staff will not be able to assist you with questions regarding bills from these companies.  You will be contacted with the lab results as soon as they are available. The fastest way to get your results is to activate your My Chart account. Instructions are located on the last page of this paperwork. If you have not heard from [place] regarding the results in 2 weeks, please contact this office.

## 2018-03-05 LAB — COMPREHENSIVE METABOLIC PANEL
ALK PHOS: 71 IU/L (ref 39–117)
ALT: 13 IU/L (ref 0–32)
AST: 13 IU/L (ref 0–40)
Albumin/Globulin Ratio: 2.1 (ref 1.2–2.2)
Albumin: 4.6 g/dL (ref 3.5–5.5)
BILIRUBIN TOTAL: 0.5 mg/dL (ref 0.0–1.2)
BUN/Creatinine Ratio: 20 (ref 9–23)
BUN: 15 mg/dL (ref 6–24)
CHLORIDE: 99 mmol/L (ref 96–106)
CO2: 24 mmol/L (ref 20–29)
Calcium: 10.1 mg/dL (ref 8.7–10.2)
Creatinine, Ser: 0.75 mg/dL (ref 0.57–1.00)
GFR calc Af Amer: 104 mL/min/{1.73_m2} (ref >59)
GFR calc non Af Amer: 90 mL/min/{1.73_m2} (ref >59)
GLUCOSE: 95 mg/dL (ref 65–99)
Globulin, Total: 2.2 g/dL (ref 1.5–4.5)
Potassium: 4.1 mmol/L (ref 3.5–5.2)
Sodium: 139 mmol/L (ref 134–144)
TOTAL PROTEIN: 6.8 g/dL (ref 6.0–8.5)

## 2018-03-05 LAB — LIPID PANEL
CHOLESTEROL TOTAL: 258 mg/dL — AB (ref 100–199)
Chol/HDL Ratio: 3 ratio (ref 0.0–4.4)
HDL: 86 mg/dL (ref >39)
LDL Calculated: 148 mg/dL — ABNORMAL HIGH (ref 0–99)
Triglycerides: 119 mg/dL (ref 0–149)
VLDL CHOLESTEROL CAL: 24 mg/dL (ref 5–40)

## 2018-03-05 LAB — VITAMIN B12: VITAMIN B 12: 558 pg/mL (ref 232–1245)

## 2018-03-11 ENCOUNTER — Ambulatory Visit: Payer: BLUE CROSS/BLUE SHIELD | Admitting: Family Medicine

## 2018-03-13 ENCOUNTER — Encounter: Payer: Self-pay | Admitting: *Deleted

## 2018-04-25 ENCOUNTER — Other Ambulatory Visit: Payer: Self-pay | Admitting: Radiology

## 2018-04-25 DIAGNOSIS — N644 Mastodynia: Secondary | ICD-10-CM | POA: Diagnosis not present

## 2018-04-25 DIAGNOSIS — N6320 Unspecified lump in the left breast, unspecified quadrant: Secondary | ICD-10-CM | POA: Diagnosis not present

## 2018-04-25 DIAGNOSIS — Z23 Encounter for immunization: Secondary | ICD-10-CM | POA: Diagnosis not present

## 2018-05-01 ENCOUNTER — Ambulatory Visit
Admission: RE | Admit: 2018-05-01 | Discharge: 2018-05-01 | Disposition: A | Discharge status: Home / Self Care | Payer: BLUE CROSS/BLUE SHIELD | Source: Ambulatory Visit | Attending: Radiology | Admitting: Radiology

## 2018-05-01 DIAGNOSIS — N6002 Solitary cyst of left breast: Secondary | ICD-10-CM | POA: Diagnosis not present

## 2018-05-01 DIAGNOSIS — N6320 Unspecified lump in the left breast, unspecified quadrant: Secondary | ICD-10-CM

## 2018-05-01 DIAGNOSIS — R922 Inconclusive mammogram: Secondary | ICD-10-CM | POA: Diagnosis not present

## 2018-05-15 ENCOUNTER — Other Ambulatory Visit: Payer: Self-pay | Admitting: Neurology

## 2018-05-15 DIAGNOSIS — I10 Essential (primary) hypertension: Secondary | ICD-10-CM

## 2018-06-28 ENCOUNTER — Other Ambulatory Visit: Payer: Self-pay | Admitting: Family Medicine

## 2018-06-28 DIAGNOSIS — I10 Essential (primary) hypertension: Secondary | ICD-10-CM

## 2018-06-28 MED ORDER — AMLODIPINE BESYLATE 2.5 MG PO TABS: 2.5000 mg | ORAL_TABLET | Freq: Every day | ORAL | 11 refills | Status: DC

## 2018-06-28 NOTE — Telephone Encounter (Signed)
Copied from [place] [phone]. Topic: Quick Communication - Rx Refill/Question >> Jun 28, 2018  8:49 AM Burchel, Abbi R wrote: Medication: amLODipine (NORVASC) 2.5 MG tablet  Preferred Pharmacy: [facility] [address] AT [address] [place] [address] Phone: [phone] Fax: [phone]  Please note pt is completely out of this medication.  Pt was advised that RX refills may take up to 3 business days. We ask that you follow-up with your pharmacy.

## 2018-07-18 ENCOUNTER — Other Ambulatory Visit: Payer: Self-pay | Admitting: Family Medicine

## 2018-07-18 DIAGNOSIS — G629 Polyneuropathy, unspecified: Secondary | ICD-10-CM

## 2018-07-23 DIAGNOSIS — Z681 Body mass index (BMI) 19 or less, adult: Secondary | ICD-10-CM | POA: Diagnosis not present

## 2018-07-23 DIAGNOSIS — Z01419 Encounter for gynecological examination (general) (routine) without abnormal findings: Secondary | ICD-10-CM | POA: Diagnosis not present

## 2018-09-20 ENCOUNTER — Other Ambulatory Visit: Payer: Self-pay | Admitting: Family Medicine

## 2018-09-20 DIAGNOSIS — G629 Polyneuropathy, unspecified: Secondary | ICD-10-CM

## 2018-09-20 MED ORDER — GABAPENTIN 300 MG PO CAPS: 300.0000 mg | ORAL_CAPSULE | Freq: Every day | ORAL | 1 refills | Status: DC
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
## 2018-09-20 NOTE — Telephone Encounter (Signed)
Copied from [place] [phone]. Topic: Quick Communication - Rx Refill/Question >> Sep 20, 2018  7:48 AM Keene Breath wrote: Medication: gabapentin (NEURONTIN) 300 MG capsule  Patient called to request a refill for the above medication  Preferred Pharmacy (with phone number or street name): [facility] [place] - [address] AT [address] [phone] (Phone) [phone] (Fax)

## 2018-12-09 ENCOUNTER — Other Ambulatory Visit: Payer: Self-pay

## 2018-12-09 ENCOUNTER — Encounter: Payer: Self-pay | Admitting: Registered Nurse

## 2018-12-09 ENCOUNTER — Ambulatory Visit: Payer: BLUE CROSS/BLUE SHIELD | Admitting: Registered Nurse

## 2018-12-09 VITALS — BP 127/74 | HR 92 | Temp 99.0°F | Resp 16 | Ht 65.75 in | Wt 110.0 lb

## 2018-12-09 DIAGNOSIS — B070 Plantar wart: Secondary | ICD-10-CM

## 2018-12-09 DIAGNOSIS — H538 Other visual disturbances: Secondary | ICD-10-CM | POA: Diagnosis not present

## 2018-12-09 DIAGNOSIS — R636 Underweight: Secondary | ICD-10-CM | POA: Diagnosis not present

## 2018-12-09 NOTE — Progress Notes (Signed)
Established Patient Office Visit  Subjective:  Patient ID: Kellie Mason, female    DOB: 09-Sep-1960  Age: 58 y.o. MRN: [id number]  CC:  Chief Complaint  Patient presents with  . Plantar Warts    pt states she has had a wart on her left foot x 6 weeks     HPI Kellie Mason presents for plantar wart  Pt states she noticed wart appearing 6-8 weeks prior to visit. 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 She reports that this has been "irritating" but not very painful. She notes that it has not changed much in the previous week. She has been using Dr. Zoe Lan wart pads for 1 week.  She also reports a change in vision today that involves loss of focus and "going cross eyed" about twice each week for the previous 3 weeks. She reports no CP, headaches, SOB, or other symptoms of hyper - or hypotension. Her BP has been well controlled on amlodipine and HCTZ. She reports having trouble reading increasing, as well as an excess of screen time.  Past Medical History:  Diagnosis Date  . Arthritis   . Basal cell carcinoma of anterior chest    shave bx - 08/12/12 - Dr. Wilhemina Bonito.  . Chest pain   . Cough   . Dizziness - light-headed   . Headache   . Hypertension   . Palpitations   . Tobacco abuse    No ready to quit    Past Surgical History:  Procedure Laterality Date  . ELECTROCARDIOGRAM    . KNEE ARTHROSCOPY Left     Family History  Problem Relation Age of Onset  . Hypertension Mother   . Cancer Mother   . Neuropathy Mother   . Hypertension Sister   . Cancer Father   . Hypertension Maternal Grandfather   . Neuropathy Brother     Social History   Socioeconomic History  . Marital status: Married    Spouse name: Not on file  . Number of children: Not on file  . Years of education: Not on file  . Highest education level: Not on file  Occupational History  . Occupation: Works for Education officer, community    Comment:  Health and safety inspector  Social Needs  . Financial resource strain: Not on file  . Food insecurity:    Worry: Not on file    Inability: Not on file  . Transportation needs:    Medical: Not on file    Non-medical: Not on file  Tobacco Use  . Smoking status: Current Every Day Smoker    Packs/day: 0.50  . Smokeless tobacco: Never Used  Substance and Sexual Activity  . Alcohol use: Yes    Alcohol/week: 2.0 standard drinks    Types: 2 Standard drinks or equivalent per week  . Drug use: No  . Sexual activity: Yes  Lifestyle  . Physical activity:    Days per week: Not on file    Minutes per session: Not on file  . Stress: Not on file  Relationships  . Social connections:    Talks on phone: Not on file    Gets together: Not on file    Attends religious service: Not on file    Active member of club or organization: Not on file    Attends meetings of clubs or organizations: Not on file    Relationship status: Not on file  . Intimate partner violence:  Fear of current or ex partner: Not on file    Emotionally abused: Not on file    Physically abused: Not on file    Forced sexual activity: Not on file  Other Topics Concern  . Not on file  Social History Narrative   Married. Exercises 5 days per week...speed walking, 3-4 miles    Outpatient Medications Prior to Visit  Medication Sig Dispense Refill  . amLODipine (NORVASC) 2.5 MG tablet Take 1 tablet (2.5 mg total) by mouth daily. 90 tablet 11  . Calcium Carbonate-Vit D-Min (CALTRATE PLUS PO) Take by mouth.      . estradiol (ESTRACE) 0.5 MG tablet Take 0.5 mg by mouth daily.    Marland Kitchen gabapentin (NEURONTIN) 300 MG capsule Take 1 capsule (300 mg total) by mouth at bedtime. 90 capsule 1  . hydrochlorothiazide (MICROZIDE) 12.5 MG capsule Take 1 capsule (12.5 mg total) by mouth every morning. 90 capsule 11  . Progesterone Micronized (PROGESTERONE PO) Take by mouth.    . vitamin B-12 (CYANOCOBALAMIN) 1000 MCG tablet Take 1,000 mcg by mouth daily.     . Biotin (BIOTIN 5000) 5 MG CAPS Take 1 capsule by mouth daily.    Marland Kitchen estradiol (ESTRACE) 1 MG tablet TK 1 T PO D     No facility-administered medications prior to visit.     Allergies  Allergen Reactions  . Tetracyclines & Related   . Codeine Rash    ROS Review of Systems  Constitutional: Negative.  Negative for chills and fever.  HENT: Negative.   Eyes: Positive for visual disturbance (as per HPI). Negative for photophobia, pain, discharge, redness and itching.  Respiratory: Negative.  Negative for cough, chest tightness, shortness of breath and wheezing.   Cardiovascular: Negative.  Negative for chest pain and palpitations.  Gastrointestinal: Negative.   Endocrine: Negative.   Genitourinary: Negative.   Musculoskeletal: Negative.   Skin:       AS PER HPI  Allergic/Immunologic: Negative.   Neurological: Negative.  Negative for dizziness, seizures, speech difficulty, weakness, light-headedness and headaches.  Hematological: Negative.   Psychiatric/Behavioral: Negative.       Objective:    Physical Exam  Constitutional: She is oriented to person, place, and time. She appears well-developed and well-nourished.  HENT:  Head: Normocephalic and atraumatic.  Eyes: Pupils are equal, round, and reactive to light. Conjunctivae and EOM are normal. Right eye exhibits no discharge. Left eye exhibits no discharge. No scleral icterus.  Neck: Normal range of motion. Neck supple.  Cardiovascular: Normal rate and regular rhythm.  Pulmonary/Chest: Effort normal and breath sounds normal.  Musculoskeletal: Normal range of motion.  Neurological: She is alert and oriented to person, place, and time.  Skin: Skin is warm, dry and intact. She is not diaphoretic. No pallor.     Psychiatric: She has a normal mood and affect. Her behavior is normal. Judgment and thought content normal.    BP 127/74   Pulse 92   Temp 99 F (37.2 C) (Oral)   Resp 16   Ht 5' 5.75" (1.67 m)   Wt 110 lb  (49.9 kg)   SpO2 99%   BMI 17.89 kg/m  Wt Readings from Last 3 Encounters:  12/09/18 110 lb (49.9 kg)  03/04/18 115 lb (52.2 kg)  11/20/17 115 lb (52.2 kg)     Health Maintenance Due  Topic Date Due  . PAP SMEAR-Modifier  03/22/2018    There are no preventive care reminders to display for this patient.  Lab Results  Component Value Date   TSH 4.250 01/03/2016   Lab Results  Component Value Date   WBC 9.9 01/09/2017   HGB 14.2 01/09/2017   HCT 40.2 01/09/2017   MCV 90.1 01/09/2017   PLT 405 (H) 01/03/2016   Lab Results  Component Value Date   NA 139 03/04/2018   K 4.1 03/04/2018   CO2 24 03/04/2018   GLUCOSE 95 03/04/2018   BUN 15 03/04/2018   CREATININE 0.75 03/04/2018   BILITOT 0.5 03/04/2018   ALKPHOS 71 03/04/2018   AST 13 03/04/2018   ALT 13 03/04/2018   PROT 6.8 03/04/2018   ALBUMIN 4.6 03/04/2018   CALCIUM 10.1 03/04/2018   Lab Results  Component Value Date   CHOL 258 (H) 03/04/2018   Lab Results  Component Value Date   HDL 86 03/04/2018   Lab Results  Component Value Date   LDLCALC 148 (H) 03/04/2018   Lab Results  Component Value Date   TRIG 119 03/04/2018   Lab Results  Component Value Date   CHOLHDL 3.0 03/04/2018   Lab Results  Component Value Date   HGBA1C 5.3 01/03/2016      Assessment & Plan:   Problem List Items Addressed This Visit    None    Visit Diagnoses    Plantar wart    -  Primary   Blurred vision       Relevant Orders   Ambulatory referral to Ophthalmology   Underweight          No orders of the defined types were placed in this encounter.   Follow-up: No follow-ups on file.   PLAN:  Plantar Wart: Typical presentation. Pt wishes to continue using Dr. Felicie Morn pads - discussed using 40% concentration, covering with duct tape, using for 48 hours at a time with cleaning and drying in between. Discussed that with patient on her feet frequently and a lot of activity, wart may not go away. Typical treatment  course is 12 weeks of salicylic acid prior to reassessment - we discussed that I would be willing to see this patient as soon as 3 weeks from today's visit for cryotherapy.  Blurred vision: Discussed eye fatigue d/t screen use and recent increase in activity as likely culprit, but patient has been followed by ophthalmology in the past and wishes to reestablish. Not likely pulmonary or neuro cause.  Underweight: discussed supplementing diet with Ensure/Boost shakes or protein powder to help maintain weight.  Patient encouraged to call clinic with any questions, comments, or concerns.  I spent 14 minutes with this patient, more than 50% of which was spent counseling/educating.  Maximiano Coss, NP

## 2018-12-09 NOTE — Patient Instructions (Signed)
° ° ° °  If you have lab work done today you will be contacted with your lab results within the next 2 weeks.  If you have not heard from us then please contact us. The fastest way to get your results is to register for My Chart. ° ° °IF you received an x-ray today, you will receive an invoice from [facility]. Please contact [facility] at [phone] with questions or concerns regarding your invoice.  ° °IF you received labwork today, you will receive an invoice from [facility]. Please contact [facility] at [phone] with questions or concerns regarding your invoice.  ° °Our billing staff will not be able to assist you with questions regarding bills from these companies. ° °You will be contacted with the lab results as soon as they are available. The fastest way to get your results is to activate your My Chart account. Instructions are located on the last page of this paperwork. If you have not heard from us regarding the results in 2 weeks, please contact this office. °  ° ° ° °

## 2019-03-12 ENCOUNTER — Other Ambulatory Visit: Payer: Self-pay | Admitting: Family Medicine

## 2019-03-12 DIAGNOSIS — G629 Polyneuropathy, unspecified: Secondary | ICD-10-CM

## 2019-06-10 ENCOUNTER — Telehealth: Payer: Self-pay | Admitting: *Deleted

## 2019-06-10 NOTE — Telephone Encounter (Signed)
Received refill request for hydrochlorothiazide from [facility]. Pt hasn't been seen since 2018 with last refills given in 2018 to last a year. Refill refused. Sent message back to pharmacy via fax indicating denial. Received a receipt of confirmation.

## 2019-06-18 ENCOUNTER — Telehealth: Payer: Self-pay | Admitting: Family Medicine

## 2019-06-18 DIAGNOSIS — I10 Essential (primary) hypertension: Secondary | ICD-10-CM

## 2019-06-18 NOTE — Telephone Encounter (Signed)
Patient requesting hydrochlorothiazide (MICROZIDE) 12.5 MG capsule and states she's completely out she thought she had refills, informed patient pleas allow 48 to 72 hour turn around time , patient would like a follow up call if medication can be sent in today.    [place], [place] AT [place]

## 2019-06-19 NOTE — Telephone Encounter (Signed)
Forwarding medication refill request to the clinical pool for review. 

## 2019-06-23 ENCOUNTER — Other Ambulatory Visit: Payer: Self-pay | Admitting: *Deleted

## 2019-06-23 DIAGNOSIS — I10 Essential (primary) hypertension: Secondary | ICD-10-CM

## 2019-06-23 MED ORDER — HYDROCHLOROTHIAZIDE 12.5 MG PO CAPS: 12.5000 mg | ORAL_CAPSULE | ORAL | 0 refills | Status: DC

## 2019-06-23 NOTE — Telephone Encounter (Signed)
Pt called requesting for Rx HZTC 12.5mg  prescribe by Dr. Jaynee Eagles last refill 04/03/2017 and has been out since last Monday. Pt have an appointment coming up 06/30/19 to see Dr. Carlota Raspberry. Please advise.

## 2019-06-23 NOTE — Telephone Encounter (Signed)
Requesting med refill  has been out since last Monday

## 2019-06-26 NOTE — Telephone Encounter (Signed)
Looks like med was sent in on 06/23/19

## 2019-06-30 ENCOUNTER — Ambulatory Visit: Payer: BC Managed Care – PPO | Admitting: Family Medicine

## 2019-06-30 ENCOUNTER — Encounter: Payer: Self-pay | Admitting: Family Medicine

## 2019-06-30 ENCOUNTER — Other Ambulatory Visit: Payer: Self-pay

## 2019-06-30 VITALS — BP 163/90 | HR 96 | Temp 97.6°F | Wt 113.2 lb

## 2019-06-30 DIAGNOSIS — E785 Hyperlipidemia, unspecified: Secondary | ICD-10-CM | POA: Diagnosis not present

## 2019-06-30 DIAGNOSIS — E538 Deficiency of other specified B group vitamins: Secondary | ICD-10-CM

## 2019-06-30 DIAGNOSIS — I10 Essential (primary) hypertension: Secondary | ICD-10-CM | POA: Diagnosis not present

## 2019-06-30 DIAGNOSIS — F17210 Nicotine dependence, cigarettes, uncomplicated: Secondary | ICD-10-CM

## 2019-06-30 DIAGNOSIS — G629 Polyneuropathy, unspecified: Secondary | ICD-10-CM

## 2019-06-30 DIAGNOSIS — M7711 Lateral epicondylitis, right elbow: Secondary | ICD-10-CM | POA: Diagnosis not present

## 2019-06-30 DIAGNOSIS — Z23 Encounter for immunization: Secondary | ICD-10-CM | POA: Diagnosis not present

## 2019-06-30 MED ORDER — AMLODIPINE BESYLATE 2.5 MG PO TABS: 2.5000 mg | ORAL_TABLET | Freq: Every day | ORAL | 1 refills | Status: DC

## 2019-06-30 MED ORDER — HYDROCHLOROTHIAZIDE 12.5 MG PO CAPS: 12.5000 mg | ORAL_CAPSULE | ORAL | 1 refills | Status: DC

## 2019-06-30 MED ORDER — SHINGRIX 50 MCG/0.5ML IM SUSR: 0.5000 mL | Freq: Once | INTRAMUSCULAR | 1 refills | Status: AC

## 2019-06-30 NOTE — Patient Instructions (Addendum)
If you need help with quitting smoking, please let me know.   Avoid downward grasping carrying or repetitive use of right arm as much as possible for now.  Wear the tennis elbow splint with activity and recheck in the next few weeks.  I will check some lab work and we can follow-up to discuss other concerns in the next few weeks.  Let me know if there are questions prior to that time.     Tennis Elbow  Tennis elbow (lateral epicondylitis) is inflammation of tendons in your outer forearm, near your elbow. Tendons are tissues that connect muscle to bone. When you have tennis elbow, inflammation affects the tendons that you use to bend your wrist and move your hand up. Inflammation occurs in the lower part of the upper arm bone (humerus), where the tendons connect to the bone (lateral epicondyle). Tennis elbow often affects people who play tennis, but anyone may get the condition from repeatedly extending the wrist or turning the forearm. What are the causes? This condition is usually caused by repeatedly extending the wrist, turning the forearm, and using the hands. It can result from sports or work that requires repetitive forearm movements. In some cases, it may be caused by a sudden injury. What increases the risk? You are more likely to develop tennis elbow if you play tennis or another racket sport. You also have a higher risk if you frequently use your hands for work. Besides people who play tennis, others at greater risk include:  Musicians.  Carpenters, painters, and plumbers.  Cooks.  Cashiers.  People who work in [employer].  Architect workers.  Butchers.  People who use computers. What are the signs or symptoms? Symptoms of this condition include:  Pain and tenderness in the forearm and the outer part of the elbow. Pain may be felt only when using the arm, or it may be there all the time.  A burning feeling that starts in the elbow and spreads down the forearm.  A  weak grip in the hand. How is this diagnosed? This condition may be diagnosed based on:  Your symptoms and medical history.  A physical exam.  X-rays.  MRI. How is this treated? Resting and icing your arm is often the first treatment. Your health care provider may also recommend:  Medicines to reduce pain and inflammation. These may be in the form of a pill, topical gels, or shots of a steroid medicine (cortisone).  An elbow strap to reduce stress on the area.  Physical therapy. This may include massage or exercises.  An elbow brace to restrict the movements that cause symptoms. If these treatments do not help relieve your symptoms, your health care provider may recommend surgery to remove damaged muscle and reattach healthy muscle to bone. Follow these instructions at home: Activity  Rest your elbow and wrist and avoid activities that cause symptoms, as told by your health care provider.  Do physical therapy exercises as instructed.  If you lift an object, lift it with your palm facing up. This reduces stress on your elbow. Lifestyle  If your tennis elbow is caused by sports, check your equipment and make sure that: ? You are using it correctly. ? It is the best fit for you.  If your tennis elbow is caused by work or computer use, take frequent breaks to stretch your arm. Talk with your manager about ways to manage your condition at work. If you have a brace:  Wear the brace or  strap as told by your health care provider. Remove it only as told by your health care provider.  Loosen the brace if your fingers tingle, become numb, or turn cold and blue.  Keep the brace clean.  If the brace is not waterproof, ask if you may remove it for bathing. If you must keep the brace on while bathing: ? Do not let it get wet. ? Cover it with a watertight covering when you take a bath or a shower. General instructions   If directed, put ice on the painful area: ? Put ice in a  plastic bag. ? Place a towel between your skin and the bag. ? Leave the ice on for 20 minutes, 2-3 times a day.  Take over-the-counter and prescription medicines only as told by your health care provider.  Keep all follow-up visits as told by your health care provider. This is important. Contact a health care provider if:  You have pain that gets worse or does not get better with treatment.  You have numbness or weakness in your forearm, hand, or fingers. Summary  Tennis elbow (lateral epicondylitis) is inflammation of tendons in your outer forearm, near your elbow.  Common symptoms include pain and tenderness in your forearm and the outer part of your elbow.  This condition is usually caused by repeatedly extending your wrist, turning your forearm, and using your hands.  The first treatment is often resting and icing your arm to relieve symptoms. Further treatment may include taking medicine, getting physical therapy, wearing a brace or strap, or having surgery. This information is not intended to replace advice given to you by your health care provider. Make sure you discuss any questions you have with your health care provider. Document Released: 07/10/2005 Document Revised: 04/05/2018 Document Reviewed: 04/24/2017 Elsevier Patient Education  2020 [employer].    Steps to Quit Smoking Smoking tobacco is the leading cause of preventable death. It can affect almost every organ in the body. Smoking puts you and those around you at risk for developing many serious chronic diseases. Quitting smoking can be difficult, but it is one of the best things that you can do for your health. It is never too late to quit. How do I get ready to quit? When you decide to quit smoking, create a plan to help you succeed. Before you quit:  Pick a date to quit. Set a date within the next 2 weeks to give you time to prepare.  Write down the reasons why you are quitting. Keep this list in places where  you will see it often.  Tell your family, friends, and co-workers that you are quitting. Support from your loved ones can make quitting easier.  Talk with your health care provider about your options for quitting smoking.  Find out what treatment options are covered by your health insurance.  Identify people, places, things, and activities that make you want to smoke (triggers). Avoid them. What first steps can I take to quit smoking?  Throw away all cigarettes at home, at work, and in your car.  Throw away smoking accessories, such as Scientist, research (medical).  Clean your car. Make sure to empty the ashtray.  Clean your home, including curtains and carpets. What strategies can I use to quit smoking? Talk with your health care provider about combining strategies, such as taking medicines while you are also receiving in-person counseling. Using these two strategies together makes you more likely to succeed in quitting than if  you used either strategy on its own.  If you are pregnant or breastfeeding, talk with your health care provider about finding counseling or other support strategies to quit smoking. Do not take medicine to help you quit smoking unless your health care provider tells you to do so. To quit smoking: Quit right away  Quit smoking completely, instead of gradually reducing how much you smoke over a period of time. Research shows that stopping smoking right away is more successful than gradually quitting.  Attend in-person counseling to help you build problem-solving skills. You are more likely to succeed in quitting if you attend counseling sessions regularly. Even short sessions of 10 minutes can be effective. Take medicine You may take medicines to help you quit smoking. Some medicines require a prescription and some you can purchase over-the-counter. Medicines may have nicotine in them to replace the nicotine in cigarettes. Medicines may:  Help to stop cravings.  Help  to relieve withdrawal symptoms. Your health care provider may recommend:  Nicotine patches, gum, or lozenges.  Nicotine inhalers or sprays.  Non-nicotine medicine that is taken by mouth. Find resources Find resources and support systems that can help you to quit smoking and remain smoke-free after you quit. These resources are most helpful when you use them often. They include:  Online chats with a Social worker.  Telephone quitlines.  Printed Furniture conservator/restorer.  Support groups or group counseling.  Text messaging programs.  Mobile phone apps or applications. Use apps that can help you stick to your quit plan by providing reminders, tips, and encouragement. There are many free apps for mobile devices as well as websites. Examples include Quit Guide from the [employer] and smokefree.gov What things can I do to make it easier to quit?   Reach out to your family and friends for support and encouragement. Call telephone quitlines (1-800-QUIT-NOW), reach out to support groups, or work with a counselor for support.  Ask people who smoke to avoid smoking around you.  Avoid places that trigger you to smoke, such as bars, parties, or smoke-break areas at work.  Spend time with people who do not smoke.  Lessen the stress in your life. Stress can be a smoking trigger for some people. To lessen stress, try: ? Exercising regularly. ? Doing deep-breathing exercises. ? Doing yoga. ? Meditating. ? Performing a body scan. This involves closing your eyes, scanning your body from head to toe, and noticing which parts of your body are particularly tense. Try to relax the muscles in those areas. How will I feel when I quit smoking? Day 1 to 3 weeks Within the first 24 hours of quitting smoking, you may start to feel withdrawal symptoms. These symptoms are usually most noticeable 2-3 days after quitting, but they usually do not last for more than 2-3 weeks. You may experience these symptoms:  Mood  swings.  Restlessness, anxiety, or irritability.  Trouble concentrating.  Dizziness.  Strong cravings for sugary foods and nicotine.  Mild weight gain.  Constipation.  Nausea.  Coughing or a sore throat.  Changes in how the medicines that you take for unrelated issues work in your body.  Depression.  Trouble sleeping (insomnia). Week 3 and afterward After the first 2-3 weeks of quitting, you may start to notice more positive results, such as:  Improved sense of smell and taste.  Decreased coughing and sore throat.  Slower heart rate.  Lower blood pressure.  Clearer skin.  The ability to breathe more easily.  Fewer sick  days. Quitting smoking can be very challenging. Do not get discouraged if you are not successful the first time. Some people need to make many attempts to quit before they achieve long-term success. Do your best to stick to your quit plan, and talk with your health care provider if you have any questions or concerns. Summary  Smoking tobacco is the leading cause of preventable death. Quitting smoking is one of the best things that you can do for your health.  When you decide to quit smoking, create a plan to help you succeed.  Quit smoking right away, not slowly over a period of time.  When you start quitting, seek help from your health care provider, family, or friends. This information is not intended to replace advice given to you by your health care provider. Make sure you discuss any questions you have with your health care provider. Document Released: 07/04/2001 Document Revised: 09/27/2018 Document Reviewed: 09/28/2018 Elsevier Patient Education  [employer].   If you have lab work done today you will be contacted with your lab results within the next 2 weeks.  If you have not heard from [place] then please contact us. The fastest way to get your results is to register for My Chart.   IF you received an x-ray today, you will receive an  invoice from [facility]. Please contact [facility] at [phone] with questions or concerns regarding your invoice.   IF you received labwork today, you will receive an invoice from [facility]. Please contact [facility] at [phone] with questions or concerns regarding your invoice.   Our billing staff will not be able to assist you with questions regarding bills from these companies.  You will be contacted with the lab results as soon as they are available. The fastest way to get your results is to activate your My Chart account. Instructions are located on the last page of this paperwork. If you have not heard from [place] regarding the results in 2 weeks, please contact this office.      Steps to Quit Smoking Smoking tobacco is the leading cause of preventable death. It can affect almost every organ in the body. Smoking puts you and those around you at risk for developing many serious chronic diseases. Quitting smoking can be difficult, but it is one of the best things that you can do for your health. It is never too late to quit. How do I get ready to quit? When you decide to quit smoking, create a plan to help you succeed. Before you quit:  Pick a date to quit. Set a date within the next 2 weeks to give you time to prepare.  Write down the reasons why you are quitting. Keep this list in places where you will see it often.  Tell your family, friends, and co-workers that you are quitting. Support from your loved ones can make quitting easier.  Talk with your health care provider about your options for quitting smoking.  Find out what treatment options are covered by your health insurance.  Identify people, places, things, and activities that make you want to smoke (triggers). Avoid them. What first steps can I take to quit smoking?  Throw away all cigarettes at home, at work, and in your car.  Throw away smoking accessories, such as Scientist, research (medical).  Clean  your car. Make sure to empty the ashtray.  Clean your home, including curtains and carpets. What strategies can I use to quit smoking? Talk with  your health care provider about combining strategies, such as taking medicines while you are also receiving in-person counseling. Using these two strategies together makes you more likely to succeed in quitting than if you used either strategy on its own.  If you are pregnant or breastfeeding, talk with your health care provider about finding counseling or other support strategies to quit smoking. Do not take medicine to help you quit smoking unless your health care provider tells you to do so. To quit smoking: Quit right away  Quit smoking completely, instead of gradually reducing how much you smoke over a period of time. Research shows that stopping smoking right away is more successful than gradually quitting.  Attend in-person counseling to help you build problem-solving skills. You are more likely to succeed in quitting if you attend counseling sessions regularly. Even short sessions of 10 minutes can be effective. Take medicine You may take medicines to help you quit smoking. Some medicines require a prescription and some you can purchase over-the-counter. Medicines may have nicotine in them to replace the nicotine in cigarettes. Medicines may:  Help to stop cravings.  Help to relieve withdrawal symptoms. Your health care provider may recommend:  Nicotine patches, gum, or lozenges.  Nicotine inhalers or sprays.  Non-nicotine medicine that is taken by mouth. Find resources Find resources and support systems that can help you to quit smoking and remain smoke-free after you quit. These resources are most helpful when you use them often. They include:  Online chats with a Social worker.  Telephone quitlines.  Printed Furniture conservator/restorer.  Support groups or group counseling.  Text messaging programs.  Mobile phone apps or applications.  Use apps that can help you stick to your quit plan by providing reminders, tips, and encouragement. There are many free apps for mobile devices as well as websites. Examples include Quit Guide from the [employer] and smokefree.gov What things can I do to make it easier to quit?   Reach out to your family and friends for support and encouragement. Call telephone quitlines (1-800-QUIT-NOW), reach out to support groups, or work with a counselor for support.  Ask people who smoke to avoid smoking around you.  Avoid places that trigger you to smoke, such as bars, parties, or smoke-break areas at work.  Spend time with people who do not smoke.  Lessen the stress in your life. Stress can be a smoking trigger for some people. To lessen stress, try: ? Exercising regularly. ? Doing deep-breathing exercises. ? Doing yoga. ? Meditating. ? Performing a body scan. This involves closing your eyes, scanning your body from head to toe, and noticing which parts of your body are particularly tense. Try to relax the muscles in those areas. How will I feel when I quit smoking? Day 1 to 3 weeks Within the first 24 hours of quitting smoking, you may start to feel withdrawal symptoms. These symptoms are usually most noticeable 2-3 days after quitting, but they usually do not last for more than 2-3 weeks. You may experience these symptoms:  Mood swings.  Restlessness, anxiety, or irritability.  Trouble concentrating.  Dizziness.  Strong cravings for sugary foods and nicotine.  Mild weight gain.  Constipation.  Nausea.  Coughing or a sore throat.  Changes in how the medicines that you take for unrelated issues work in your body.  Depression.  Trouble sleeping (insomnia). Week 3 and afterward After the first 2-3 weeks of quitting, you may start to notice more positive results, such as:  Improved  sense of smell and taste.  Decreased coughing and sore throat.  Slower heart rate.  Lower blood  pressure.  Clearer skin.  The ability to breathe more easily.  Fewer sick days. Quitting smoking can be very challenging. Do not get discouraged if you are not successful the first time. Some people need to make many attempts to quit before they achieve long-term success. Do your best to stick to your quit plan, and talk with your health care provider if you have any questions or concerns. Summary  Smoking tobacco is the leading cause of preventable death. Quitting smoking is one of the best things that you can do for your health.  When you decide to quit smoking, create a plan to help you succeed.  Quit smoking right away, not slowly over a period of time.  When you start quitting, seek help from your health care provider, family, or friends. This information is not intended to replace advice given to you by your health care provider. Make sure you discuss any questions you have with your health care provider. Document Released: 07/04/2001 Document Revised: 09/27/2018 Document Reviewed: 09/28/2018 Elsevier Patient Education  2020 [employer].

## 2019-06-30 NOTE — Progress Notes (Signed)
Subjective:  Patient ID: Kellie Mason, female    DOB: 28-Feb-1961  Age: 58 y.o. MRN: [id number]  CC:  Chief Complaint  Patient presents with  . Medication Refill    Patient need a refill on [facility] been without this med for 2 weeks    HPI [name] presents for med refills.   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    Hypertension: Last visit with me in August 2019, briefly discussed with Maximiano Coss at acute visit in May.  Some blurred vision symptoms at that time, recommended ophthalmology follow-up.  Continue on amlodipine and hydrochlorothiazide same doses.  Blood pressure was controlled at that time. Has been out of hydrochlorothiazide past few weeks. Some fatigue past week.  No fever/cough/dyspnea/chest pains.  No recurrence of blurry vision - noted with use of phone too long. Did not see optho.  Home readings: none.  BP Readings from Last 3 Encounters:  06/30/19 (!) 163/90  12/09/18 127/74  03/04/18 (!) 142/72   Lab Results  Component Value Date   CREATININE 0.75 03/04/2018   Peripheral neuropathy with B12 deficiency 300 mg gabapentin at bedtime, vitamin B12 1000 mcg once per day when last discussed August 2019.  Has been treated by neurology, Dr. Jaynee Eagles in the past. meds working well. Less foot pain without socks.  Some R forearm/elbow to wrist soreness with home cleaning. No neck pain.  Past few months. Sore to hold heavy objects. R hand dominant.  Tx: copper glove - no relief.   Lab Results  Component Value Date   [id number] 03/04/2018   Tobacco abuse Discussed in August last year, not ready to quit at that time.  Half pack per day at that time. Goal of quitting sometime next year.   Hyperlipidemia: Lab Results  Component Value Date   CHOL 258 (H) 03/04/2018   HDL 86 03/04/2018   LDLCALC 148 (H) 03/04/2018   TRIG 119 03/04/2018   CHOLHDL 3.0 03/04/2018   Lab Results  Component Value Date   ALT 13 03/04/2018   AST 13  03/04/2018   ALKPHOS 71 03/04/2018   BILITOT 0.5 03/04/2018  The 10-year ASCVD risk score Mikey Bussing DC Jr., et al., 2013) is: 9.6%   Values used to calculate the score:     Age: 73 years     Sex: Female     Is Non-Hispanic African American: No     Diabetic: No     Tobacco smoker: Yes     Systolic Blood Pressure: [id number] mmHg     Is BP treated: Yes     HDL Cholesterol: 86 mg/dL     Total Cholesterol: 258 mg/dL   History Patient Active Problem List   Diagnosis Date Noted  . Vitamin B12 deficiency neuropathy ([place]) 08/08/2016  . Neuropathy 01/04/2016  . History of basal cell carcinoma 11/18/2012  . Atopic dermatitis 04/12/2012  . Hypertension 02/06/2011  . Tobacco use disorder 02/06/2011  . Healthcare maintenance 02/06/2011   Past Medical History:  Diagnosis Date  . Arthritis   . Basal cell carcinoma of anterior chest    shave bx - 08/12/12 - Dr. Wilhemina Bonito.  . Chest pain   . Cough   . Dizziness - light-headed   . Headache   . Hypertension   . Palpitations   . Tobacco abuse    No ready to quit   Past Surgical History:  Procedure Laterality Date  . ELECTROCARDIOGRAM    . KNEE ARTHROSCOPY  Left    Allergies  Allergen Reactions  . Tetracyclines & Related   . Codeine Rash   Prior to Admission medications   Medication Sig Start Date End Date Taking? Authorizing Provider  amLODipine (NORVASC) 2.5 MG tablet Take 1 tablet (2.5 mg total) by mouth daily. 06/28/18  Yes Wendie Agreste, MD  Calcium Carbonate-Vit D-Min (CALTRATE PLUS PO) Take by mouth.     Yes [provider]  estradiol (ESTRACE) 0.5 MG tablet Take 0.5 mg by mouth daily.   Yes [provider]  gabapentin (NEURONTIN) 300 MG capsule TAKE 1 CAPSULE(300 MG) BY MOUTH AT BEDTIME 03/12/19  Yes Wendie Agreste, MD  hydrochlorothiazide (MICROZIDE) 12.5 MG capsule Take 1 capsule (12.5 mg total) by mouth every morning. 06/23/19  Yes Wendie Agreste, MD  Progesterone Micronized (PROGESTERONE PO) Take by mouth.    Yes [provider]  vitamin B-12 (CYANOCOBALAMIN) 1000 MCG tablet Take 1,000 mcg by mouth daily.   Yes [provider]   Social History   Socioeconomic History  . Marital status: Married    Spouse name: Not on file  . Number of children: Not on file  . Years of education: Not on file  . Highest education level: Not on file  Occupational History  . Occupation: Works for Education officer, community    Comment: Health and safety inspector  Social Needs  . Financial resource strain: Not on file  . Food insecurity    Worry: Not on file    Inability: Not on file  . Transportation needs    Medical: Not on file    Non-medical: Not on file  Tobacco Use  . Smoking status: Current Every Day Smoker    Packs/day: 0.50  . Smokeless tobacco: Never Used  Substance and Sexual Activity  . Alcohol use: Yes    Alcohol/week: 2.0 standard drinks    Types: 2 Standard drinks or equivalent per week  . Drug use: No  . Sexual activity: Yes  Lifestyle  . Physical activity    Days per week: Not on file    Minutes per session: Not on file  . Stress: Not on file  Relationships  . Social Herbalist on phone: Not on file    Gets together: Not on file    Attends religious service: Not on file    Active member of club or organization: Not on file    Attends meetings of clubs or organizations: Not on file    Relationship status: Not on file  . Intimate partner violence    Fear of current or ex partner: Not on file    Emotionally abused: Not on file    Physically abused: Not on file    Forced sexual activity: Not on file  Other Topics Concern  . Not on file  Social History Narrative   Married. Exercises 5 days per week...speed walking, 3-4 miles    Review of Systems  Constitutional: Negative for fatigue and unexpected weight change.  Respiratory: Negative for chest tightness and shortness of breath.   Cardiovascular: Negative for chest pain, palpitations and leg swelling.   Gastrointestinal: Negative for abdominal pain and blood in stool.  Neurological: Negative for dizziness, syncope, light-headedness and headaches.     Objective:   Vitals:   06/30/19 1118  BP: (!) 163/90  Pulse: 96  Temp: 97.6 F (36.4 C)  TempSrc: Oral  SpO2: 97%  Weight: 113 lb 3.2 oz (51.3 kg)     Physical Exam  Vitals signs reviewed.  Constitutional:      Appearance: She is well-developed.  HENT:     Head: Normocephalic and atraumatic.  Eyes:     Conjunctiva/sclera: Conjunctivae normal.     Pupils: Pupils are equal, round, and reactive to light.  Neck:     Vascular: No carotid bruit.  Cardiovascular:     Rate and Rhythm: Normal rate and regular rhythm.     Heart sounds: Normal heart sounds.  Pulmonary:     Effort: Pulmonary effort is normal.     Breath sounds: Normal breath sounds.  Abdominal:     Palpations: Abdomen is soft. There is no pulsatile mass.     Tenderness: There is no abdominal tenderness.  Musculoskeletal:     Right elbow: She exhibits normal range of motion, no swelling and no effusion. Tenderness found. Lateral epicondyle (primarily lateral epicondyle ttp, pain with resisted wrist extension in same area.) tenderness noted. No medial epicondyle and no olecranon process tenderness noted.     Right wrist: She exhibits no tenderness (neg tinels. ).  Skin:    General: Skin is warm and dry.  Neurological:     Mental Status: She is alert and oriented to person, place, and time.  Psychiatric:        Behavior: Behavior normal.        Assessment & Plan:  FLORINDA JULSON is a 58 y.o. female . Essential hypertension - Plan: hydrochlorothiazide (MICROZIDE) 12.5 MG capsule, amLODipine (NORVASC) 2.5 MG tablet  -Decreased control off hydrochlorothiazide.  Restart same regimen, recheck next few weeks.  Labs pending.  B12 deficiency, Neuropathy  -Continue supplementation, check levels.  Continue gabapentin same dose for now.  Need for shingles vaccine -  Plan: Zoster Vaccine Adjuvanted [facility]) injection sent to pharmacy  Lateral epicondylitis of right elbow - Plan: Apply other splint  -Suspect primarily lateral epicondylitis.  Does describe some radicular symptoms down the forearm towards wrist but negative Tinel's, less likely carpal tunnel.  Denies neck symptoms or upper arm symptoms, less likely cervical radiculopathy.  -Start with counterforce brace, avoidance of offending activities for lateral epicondylitis including downward grasping carrying/repetitive activities and recheck in 3 weeks.  Hyperlipidemia, unspecified hyperlipidemia type - Plan: Comprehensive metabolic panel, Lipid panel  -Slight elevated 10-year ASCVD risk in office but that is based on elevated blood pressure.  Check labs, recheck 10-year risk score with controlled BP.  Tobacco abuse/nicotine addiction Plans on quitting next year, handout given on steps to quit and advised to let me know if I can be of assistance.  Meds ordered this encounter  Medications  . Zoster Vaccine Adjuvanted [facility]) injection    Sig: Inject 0.5 mLs into the muscle once for 1 dose. Repeat in 2-6 months.    Dispense:  0.5 mL    Refill:  1  . hydrochlorothiazide (MICROZIDE) 12.5 MG capsule    Sig: Take 1 capsule (12.5 mg total) by mouth every morning.    Dispense:  90 capsule    Refill:  1  . amLODipine (NORVASC) 2.5 MG tablet    Sig: Take 1 tablet (2.5 mg total) by mouth daily.    Dispense:  90 tablet    Refill:  1   Patient Instructions   If you need help with quitting smoking, please let me know.   Avoid downward grasping carrying or repetitive use of right arm as much as possible for now.  Wear the tennis elbow splint with activity and recheck in the next few  weeks.  I will check some lab work and we can follow-up to discuss other concerns in the next few weeks.  Let me know if there are questions prior to that time.     Tennis Elbow  Tennis elbow (lateral epicondylitis)  is inflammation of tendons in your outer forearm, near your elbow. Tendons are tissues that connect muscle to bone. When you have tennis elbow, inflammation affects the tendons that you use to bend your wrist and move your hand up. Inflammation occurs in the lower part of the upper arm bone (humerus), where the tendons connect to the bone (lateral epicondyle). Tennis elbow often affects people who play tennis, but anyone may get the condition from repeatedly extending the wrist or turning the forearm. What are the causes? This condition is usually caused by repeatedly extending the wrist, turning the forearm, and using the hands. It can result from sports or work that requires repetitive forearm movements. In some cases, it may be caused by a sudden injury. What increases the risk? You are more likely to develop tennis elbow if you play tennis or another racket sport. You also have a higher risk if you frequently use your hands for work. Besides people who play tennis, others at greater risk include:  Musicians.  Carpenters, painters, and plumbers.  Cooks.  Cashiers.  People who work in [employer].  Architect workers.  Butchers.  People who use computers. What are the signs or symptoms? Symptoms of this condition include:  Pain and tenderness in the forearm and the outer part of the elbow. Pain may be felt only when using the arm, or it may be there all the time.  A burning feeling that starts in the elbow and spreads down the forearm.  A weak grip in the hand. How is this diagnosed? This condition may be diagnosed based on:  Your symptoms and medical history.  A physical exam.  X-rays.  MRI. How is this treated? Resting and icing your arm is often the first treatment. Your health care provider may also recommend:  Medicines to reduce pain and inflammation. These may be in the form of a pill, topical gels, or shots of a steroid medicine (cortisone).  An elbow strap to  reduce stress on the area.  Physical therapy. This may include massage or exercises.  An elbow brace to restrict the movements that cause symptoms. If these treatments do not help relieve your symptoms, your health care provider may recommend surgery to remove damaged muscle and reattach healthy muscle to bone. Follow these instructions at home: Activity  Rest your elbow and wrist and avoid activities that cause symptoms, as told by your health care provider.  Do physical therapy exercises as instructed.  If you lift an object, lift it with your palm facing up. This reduces stress on your elbow. Lifestyle  If your tennis elbow is caused by sports, check your equipment and make sure that: ? You are using it correctly. ? It is the best fit for you.  If your tennis elbow is caused by work or computer use, take frequent breaks to stretch your arm. Talk with your manager about ways to manage your condition at work. If you have a brace:  Wear the brace or strap as told by your health care provider. Remove it only as told by your health care provider.  Loosen the brace if your fingers tingle, become numb, or turn cold and blue.  Keep the brace clean.  If the brace  is not waterproof, ask if you may remove it for bathing. If you must keep the brace on while bathing: ? Do not let it get wet. ? Cover it with a watertight covering when you take a bath or a shower. General instructions   If directed, put ice on the painful area: ? Put ice in a plastic bag. ? Place a towel between your skin and the bag. ? Leave the ice on for 20 minutes, 2-3 times a day.  Take over-the-counter and prescription medicines only as told by your health care provider.  Keep all follow-up visits as told by your health care provider. This is important. Contact a health care provider if:  You have pain that gets worse or does not get better with treatment.  You have numbness or weakness in your forearm, hand,  or fingers. Summary  Tennis elbow (lateral epicondylitis) is inflammation of tendons in your outer forearm, near your elbow.  Common symptoms include pain and tenderness in your forearm and the outer part of your elbow.  This condition is usually caused by repeatedly extending your wrist, turning your forearm, and using your hands.  The first treatment is often resting and icing your arm to relieve symptoms. Further treatment may include taking medicine, getting physical therapy, wearing a brace or strap, or having surgery. This information is not intended to replace advice given to you by your health care provider. Make sure you discuss any questions you have with your health care provider. Document Released: 07/10/2005 Document Revised: 04/05/2018 Document Reviewed: 04/24/2017 Elsevier Patient Education  2020 [employer].    Steps to Quit Smoking Smoking tobacco is the leading cause of preventable death. It can affect almost every organ in the body. Smoking puts you and those around you at risk for developing many serious chronic diseases. Quitting smoking can be difficult, but it is one of the best things that you can do for your health. It is never too late to quit. How do I get ready to quit? When you decide to quit smoking, create a plan to help you succeed. Before you quit:  Pick a date to quit. Set a date within the next 2 weeks to give you time to prepare.  Write down the reasons why you are quitting. Keep this list in places where you will see it often.  Tell your family, friends, and co-workers that you are quitting. Support from your loved ones can make quitting easier.  Talk with your health care provider about your options for quitting smoking.  Find out what treatment options are covered by your health insurance.  Identify people, places, things, and activities that make you want to smoke (triggers). Avoid them. What first steps can I take to quit smoking?  Throw  away all cigarettes at home, at work, and in your car.  Throw away smoking accessories, such as Scientist, research (medical).  Clean your car. Make sure to empty the ashtray.  Clean your home, including curtains and carpets. What strategies can I use to quit smoking? Talk with your health care provider about combining strategies, such as taking medicines while you are also receiving in-person counseling. Using these two strategies together makes you more likely to succeed in quitting than if you used either strategy on its own.  If you are pregnant or breastfeeding, talk with your health care provider about finding counseling or other support strategies to quit smoking. Do not take medicine to help you quit smoking unless your health  care provider tells you to do so. To quit smoking: Quit right away  Quit smoking completely, instead of gradually reducing how much you smoke over a period of time. Research shows that stopping smoking right away is more successful than gradually quitting.  Attend in-person counseling to help you build problem-solving skills. You are more likely to succeed in quitting if you attend counseling sessions regularly. Even short sessions of 10 minutes can be effective. Take medicine You may take medicines to help you quit smoking. Some medicines require a prescription and some you can purchase over-the-counter. Medicines may have nicotine in them to replace the nicotine in cigarettes. Medicines may:  Help to stop cravings.  Help to relieve withdrawal symptoms. Your health care provider may recommend:  Nicotine patches, gum, or lozenges.  Nicotine inhalers or sprays.  Non-nicotine medicine that is taken by mouth. Find resources Find resources and support systems that can help you to quit smoking and remain smoke-free after you quit. These resources are most helpful when you use them often. They include:  Online chats with a Social worker.  Telephone quitlines.   Printed Furniture conservator/restorer.  Support groups or group counseling.  Text messaging programs.  Mobile phone apps or applications. Use apps that can help you stick to your quit plan by providing reminders, tips, and encouragement. There are many free apps for mobile devices as well as websites. Examples include Quit Guide from the [employer] and smokefree.gov What things can I do to make it easier to quit?   Reach out to your family and friends for support and encouragement. Call telephone quitlines (1-800-QUIT-NOW), reach out to support groups, or work with a counselor for support.  Ask people who smoke to avoid smoking around you.  Avoid places that trigger you to smoke, such as bars, parties, or smoke-break areas at work.  Spend time with people who do not smoke.  Lessen the stress in your life. Stress can be a smoking trigger for some people. To lessen stress, try: ? Exercising regularly. ? Doing deep-breathing exercises. ? Doing yoga. ? Meditating. ? Performing a body scan. This involves closing your eyes, scanning your body from head to toe, and noticing which parts of your body are particularly tense. Try to relax the muscles in those areas. How will I feel when I quit smoking? Day 1 to 3 weeks Within the first 24 hours of quitting smoking, you may start to feel withdrawal symptoms. These symptoms are usually most noticeable 2-3 days after quitting, but they usually do not last for more than 2-3 weeks. You may experience these symptoms:  Mood swings.  Restlessness, anxiety, or irritability.  Trouble concentrating.  Dizziness.  Strong cravings for sugary foods and nicotine.  Mild weight gain.  Constipation.  Nausea.  Coughing or a sore throat.  Changes in how the medicines that you take for unrelated issues work in your body.  Depression.  Trouble sleeping (insomnia). Week 3 and afterward After the first 2-3 weeks of quitting, you may start to notice more positive  results, such as:  Improved sense of smell and taste.  Decreased coughing and sore throat.  Slower heart rate.  Lower blood pressure.  Clearer skin.  The ability to breathe more easily.  Fewer sick days. Quitting smoking can be very challenging. Do not get discouraged if you are not successful the first time. Some people need to make many attempts to quit before they achieve long-term success. Do your best to stick to your quit plan,  and talk with your health care provider if you have any questions or concerns. Summary  Smoking tobacco is the leading cause of preventable death. Quitting smoking is one of the best things that you can do for your health.  When you decide to quit smoking, create a plan to help you succeed.  Quit smoking right away, not slowly over a period of time.  When you start quitting, seek help from your health care provider, family, or friends. This information is not intended to replace advice given to you by your health care provider. Make sure you discuss any questions you have with your health care provider. Document Released: 07/04/2001 Document Revised: 09/27/2018 Document Reviewed: 09/28/2018 Elsevier Patient Education  [employer].   If you have lab work done today you will be contacted with your lab results within the next 2 weeks.  If you have not heard from [place] then please contact us. The fastest way to get your results is to register for My Chart.   IF you received an x-ray today, you will receive an invoice from [facility]. Please contact [facility] at [phone] with questions or concerns regarding your invoice.   IF you received labwork today, you will receive an invoice from [facility]. Please contact [facility] at [phone] with questions or concerns regarding your invoice.   Our billing staff will not be able to assist you with questions regarding bills from these companies.  You will be contacted with the lab  results as soon as they are available. The fastest way to get your results is to activate your My Chart account. Instructions are located on the last page of this paperwork. If you have not heard from [place] regarding the results in 2 weeks, please contact this office.      Steps to Quit Smoking Smoking tobacco is the leading cause of preventable death. It can affect almost every organ in the body. Smoking puts you and those around you at risk for developing many serious chronic diseases. Quitting smoking can be difficult, but it is one of the best things that you can do for your health. It is never too late to quit. How do I get ready to quit? When you decide to quit smoking, create a plan to help you succeed. Before you quit:  Pick a date to quit. Set a date within the next 2 weeks to give you time to prepare.  Write down the reasons why you are quitting. Keep this list in places where you will see it often.  Tell your family, friends, and co-workers that you are quitting. Support from your loved ones can make quitting easier.  Talk with your health care provider about your options for quitting smoking.  Find out what treatment options are covered by your health insurance.  Identify people, places, things, and activities that make you want to smoke (triggers). Avoid them. What first steps can I take to quit smoking?  Throw away all cigarettes at home, at work, and in your car.  Throw away smoking accessories, such as Scientist, research (medical).  Clean your car. Make sure to empty the ashtray.  Clean your home, including curtains and carpets. What strategies can I use to quit smoking? Talk with your health care provider about combining strategies, such as taking medicines while you are also receiving in-person counseling. Using these two strategies together makes you more likely to succeed in quitting than if you used either strategy on its own.  If you  are pregnant or breastfeeding, talk  with your health care provider about finding counseling or other support strategies to quit smoking. Do not take medicine to help you quit smoking unless your health care provider tells you to do so. To quit smoking: Quit right away  Quit smoking completely, instead of gradually reducing how much you smoke over a period of time. Research shows that stopping smoking right away is more successful than gradually quitting.  Attend in-person counseling to help you build problem-solving skills. You are more likely to succeed in quitting if you attend counseling sessions regularly. Even short sessions of 10 minutes can be effective. Take medicine You may take medicines to help you quit smoking. Some medicines require a prescription and some you can purchase over-the-counter. Medicines may have nicotine in them to replace the nicotine in cigarettes. Medicines may:  Help to stop cravings.  Help to relieve withdrawal symptoms. Your health care provider may recommend:  Nicotine patches, gum, or lozenges.  Nicotine inhalers or sprays.  Non-nicotine medicine that is taken by mouth. Find resources Find resources and support systems that can help you to quit smoking and remain smoke-free after you quit. These resources are most helpful when you use them often. They include:  Online chats with a Social worker.  Telephone quitlines.  Printed Furniture conservator/restorer.  Support groups or group counseling.  Text messaging programs.  Mobile phone apps or applications. Use apps that can help you stick to your quit plan by providing reminders, tips, and encouragement. There are many free apps for mobile devices as well as websites. Examples include Quit Guide from the [employer] and smokefree.gov What things can I do to make it easier to quit?   Reach out to your family and friends for support and encouragement. Call telephone quitlines (1-800-QUIT-NOW), reach out to support groups, or work with a counselor for  support.  Ask people who smoke to avoid smoking around you.  Avoid places that trigger you to smoke, such as bars, parties, or smoke-break areas at work.  Spend time with people who do not smoke.  Lessen the stress in your life. Stress can be a smoking trigger for some people. To lessen stress, try: ? Exercising regularly. ? Doing deep-breathing exercises. ? Doing yoga. ? Meditating. ? Performing a body scan. This involves closing your eyes, scanning your body from head to toe, and noticing which parts of your body are particularly tense. Try to relax the muscles in those areas. How will I feel when I quit smoking? Day 1 to 3 weeks Within the first 24 hours of quitting smoking, you may start to feel withdrawal symptoms. These symptoms are usually most noticeable 2-3 days after quitting, but they usually do not last for more than 2-3 weeks. You may experience these symptoms:  Mood swings.  Restlessness, anxiety, or irritability.  Trouble concentrating.  Dizziness.  Strong cravings for sugary foods and nicotine.  Mild weight gain.  Constipation.  Nausea.  Coughing or a sore throat.  Changes in how the medicines that you take for unrelated issues work in your body.  Depression.  Trouble sleeping (insomnia). Week 3 and afterward After the first 2-3 weeks of quitting, you may start to notice more positive results, such as:  Improved sense of smell and taste.  Decreased coughing and sore throat.  Slower heart rate.  Lower blood pressure.  Clearer skin.  The ability to breathe more easily.  Fewer sick days. Quitting smoking can be very challenging. Do not get  discouraged if you are not successful the first time. Some people need to make many attempts to quit before they achieve long-term success. Do your best to stick to your quit plan, and talk with your health care provider if you have any questions or concerns. Summary  Smoking tobacco is the leading cause of  preventable death. Quitting smoking is one of the best things that you can do for your health.  When you decide to quit smoking, create a plan to help you succeed.  Quit smoking right away, not slowly over a period of time.  When you start quitting, seek help from your health care provider, family, or friends. This information is not intended to replace advice given to you by your health care provider. Make sure you discuss any questions you have with your health care provider. Document Released: 07/04/2001 Document Revised: 09/27/2018 Document Reviewed: 09/28/2018 Elsevier Patient Education  2020 [employer].      Signed, [name], MD [facility]

## 2019-07-01 LAB — COMPREHENSIVE METABOLIC PANEL
ALT: 9 IU/L (ref 0–32)
AST: 14 IU/L (ref 0–40)
Albumin/Globulin Ratio: 2.2 (ref 1.2–2.2)
Albumin: 4.7 g/dL (ref 3.8–4.9)
Alkaline Phosphatase: 66 IU/L (ref 39–117)
BUN/Creatinine Ratio: 20 (ref 9–23)
BUN: 13 mg/dL (ref 6–24)
Bilirubin Total: 0.5 mg/dL (ref 0.0–1.2)
CO2: 16 mmol/L — ABNORMAL LOW (ref 20–29)
Calcium: 9.4 mg/dL (ref 8.7–10.2)
Chloride: 103 mmol/L (ref 96–106)
Creatinine, Ser: 0.64 mg/dL (ref 0.57–1.00)
GFR calc Af Amer: 115 mL/min/{1.73_m2} (ref >59)
GFR calc non Af Amer: 100 mL/min/{1.73_m2} (ref >59)
Globulin, Total: 2.1 g/dL (ref 1.5–4.5)
Glucose: 87 mg/dL (ref 65–99)
Potassium: 4.4 mmol/L (ref 3.5–5.2)
Sodium: 139 mmol/L (ref 134–144)
Total Protein: 6.8 g/dL (ref 6.0–8.5)

## 2019-07-01 LAB — LIPID PANEL
Chol/HDL Ratio: 2.4 ratio (ref 0.0–4.4)
Cholesterol, Total: 230 mg/dL — ABNORMAL HIGH (ref 100–199)
HDL: 97 mg/dL (ref >39)
LDL Chol Calc (NIH): 114 mg/dL — ABNORMAL HIGH (ref 0–99)
Triglycerides: 111 mg/dL (ref 0–149)
VLDL Cholesterol Cal: 19 mg/dL (ref 5–40)

## 2019-07-01 LAB — VITAMIN B12: Vitamin B-12: 756 pg/mL (ref 232–1245)

## 2019-07-08 ENCOUNTER — Encounter: Payer: Self-pay | Admitting: Radiology

## 2019-07-28 ENCOUNTER — Ambulatory Visit: Payer: BC Managed Care – PPO | Admitting: Family Medicine

## 2019-08-04 ENCOUNTER — Ambulatory Visit: Payer: BC Managed Care – PPO | Attending: Internal Medicine

## 2019-08-04 DIAGNOSIS — Z20822 Contact with and (suspected) exposure to covid-19: Secondary | ICD-10-CM

## 2019-08-06 LAB — NOVEL CORONAVIRUS, NAA: SARS-CoV-2, NAA: NOT DETECTED

## 2019-09-01 ENCOUNTER — Other Ambulatory Visit: Payer: Self-pay

## 2019-09-01 ENCOUNTER — Encounter: Payer: Self-pay | Admitting: Emergency Medicine

## 2019-09-01 ENCOUNTER — Ambulatory Visit: Payer: BC Managed Care – PPO | Admitting: Emergency Medicine

## 2019-09-01 ENCOUNTER — Ambulatory Visit (INDEPENDENT_AMBULATORY_CARE_PROVIDER_SITE_OTHER): Payer: BC Managed Care – PPO

## 2019-09-01 VITALS — BP 144/82 | HR 93 | Temp 99.3°F | Resp 16 | Ht 65.0 in | Wt 110.0 lb

## 2019-09-01 DIAGNOSIS — M7711 Lateral epicondylitis, right elbow: Secondary | ICD-10-CM

## 2019-09-01 DIAGNOSIS — M25521 Pain in right elbow: Secondary | ICD-10-CM

## 2019-09-01 MED ORDER — METHYLPREDNISOLONE 4 MG PO TBPK: ORAL_TABLET | ORAL | 1 refills | Status: DC

## 2019-09-01 MED ORDER — MELOXICAM 7.5 MG PO TABS: 7.5000 mg | ORAL_TABLET | Freq: Every day | ORAL | 0 refills | Status: DC

## 2019-09-01 NOTE — Patient Instructions (Addendum)
If you have lab work done today you will be contacted with your lab results within the next 2 weeks.  If you have not heard from [place] then please contact us. The fastest way to get your results is to register for My Chart.   IF you received an x-ray today, you will receive an invoice from [facility]. Please contact [facility] at [phone] with questions or concerns regarding your invoice.   IF you received labwork today, you will receive an invoice from [place]. Please contact [facility] at [phone] with questions or concerns regarding your invoice.   Our billing staff will not be able to assist you with questions regarding bills from these companies.  You will be contacted with the lab results as soon as they are available. The fastest way to get your results is to activate your My Chart account. Instructions are located on the last page of this paperwork. If you have not heard from [place] regarding the results in 2 weeks, please contact this office.     Tennis Elbow Tennis elbow is swelling (inflammation) in your outer forearm, near your elbow. Swelling affects the tissues that connect muscle to bone (tendons). Tennis elbow can happen in any sport or job in which you use your elbow too much. It is caused by doing the same motion over and over. Tennis elbow can cause:  Pain and tenderness in your forearm and the outer part of your elbow. You may have pain all the time, or only when using the arm.  A burning feeling. This runs from your elbow through your arm.  Weak grip in your hand. Follow these instructions at home: Activity  Rest your elbow and wrist. Avoid activities that cause problems, as told by your doctor.  If told by your doctor, wear an elbow strap to reduce stress on the area.  Do physical therapy exercises as told.  If you lift an object, lift it with your palm facing up. This is easier on your elbow. Lifestyle  If your tennis elbow is caused  by sports, check your equipment and make sure that: ? You are using it correctly. ? It fits you well.  If your tennis elbow is caused by work or by using a computer, take breaks often to stretch your arm. Talk with your manager about how you can manage your condition at work. If you have a brace:  Wear the brace as told by your doctor. Remove it only as told by your doctor.  Loosen the brace if your fingers tingle, get numb, or turn cold and blue.  Keep the brace clean.  If the brace is not waterproof, ask your doctor if you may take the brace off for bathing. If you must keep the brace on while bathing: ? Do not let it get wet. ? Cover it with a watertight covering when you take a bath or a shower. General instructions   If told, put ice on the painful area: ? Put ice in a plastic bag. ? Place a towel between your skin and the bag. ? Leave the ice on for 20 minutes, 2-3 times a day.  Take over-the-counter and prescription medicines only as told by your doctor.  Keep all follow-up visits as told by your doctor. This is important. Contact a doctor if:  Your pain does not get better with treatment.  Your pain gets worse.  You have weakness in your forearm, hand, or fingers.  You cannot feel  your forearm, hand, or fingers. Summary  Tennis elbow is swelling (inflammation) in your outer forearm, near your elbow.  Tennis elbow is caused by doing the same motion over and over.  Rest your elbow and wrist. Avoid activities that cause problems, as told by your doctor.  If told, put ice on the painful area for 20 minutes, 2-3 times a day. This information is not intended to replace advice given to you by your health care provider. Make sure you discuss any questions you have with your health care provider. Document Revised: 04/05/2018 Document Reviewed: 04/24/2017 Elsevier Patient Education  [place].

## 2019-09-01 NOTE — Progress Notes (Signed)
Kellie Mason 59 y.o.   Chief Complaint  Patient presents with  . 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 female complaining of right elbow pain for several months.  Has been diagnosed with tennis elbow in the past. Requesting x-ray.  Denies direct injury.  Chronic physical work cleaning houses.  Sharp steady pain radiating down to right hand.  No other associated symptoms.  HPI   Prior to Admission medications   Medication Sig Start Date End Date Taking? Authorizing Provider  amLODipine (NORVASC) 2.5 MG tablet Take 1 tablet (2.5 mg total) by mouth daily. 06/30/19  Yes Wendie Agreste, MD  Calcium Carbonate-Vit D-Min (CALTRATE PLUS PO) Take by mouth.     Yes [provider]  estradiol (ESTRACE) 0.5 MG tablet Take 0.5 mg by mouth daily.   Yes [provider]  gabapentin (NEURONTIN) 300 MG capsule TAKE 1 CAPSULE(300 MG) BY MOUTH AT BEDTIME 03/12/19  Yes Wendie Agreste, MD  hydrochlorothiazide (MICROZIDE) 12.5 MG capsule Take 1 capsule (12.5 mg total) by mouth every morning. 06/30/19  Yes Wendie Agreste, MD  Progesterone Micronized (PROGESTERONE PO) Take by mouth.   Yes [provider]  vitamin B-12 (CYANOCOBALAMIN) 1000 MCG tablet Take 1,000 mcg by mouth daily.   Yes [provider]    Allergies  Allergen Reactions  . Tetracyclines & Related   . Codeine Rash    Patient Active Problem List   Diagnosis Date Noted  . Vitamin B12 deficiency neuropathy ([place]) 08/08/2016  . Neuropathy 01/04/2016  . History of basal cell carcinoma 11/18/2012  . Hypertension 02/06/2011  . Tobacco use disorder 02/06/2011    Past Medical History:  Diagnosis Date  . Arthritis   . Basal cell carcinoma of anterior chest    shave bx - 08/12/12 - Dr. Wilhemina Bonito.  . Chest pain   . Cough   . Dizziness - light-headed   . Headache   . Hypertension   . Palpitations   . Tobacco abuse     No ready to quit    Past Surgical History:  Procedure Laterality Date  . ELECTROCARDIOGRAM    . KNEE ARTHROSCOPY Left     Social History   Socioeconomic History  . Marital status: Married    Spouse name: Not on file  . Number of children: Not on file  . Years of education: Not on file  . Highest education level: Not on file  Occupational History  . Occupation: Works for Education officer, community    Comment: Health and safety inspector  Tobacco Use  . Smoking status: Current Every Day Smoker    Packs/day: 0.50  . Smokeless tobacco: Never Used  Substance and Sexual Activity  . Alcohol use: Yes    Alcohol/week: 2.0 standard drinks    Types: 2 Standard drinks or equivalent per week  . Drug use: No  . Sexual activity: Yes  Other Topics Concern  . Not on file  Social History Narrative   Married. Exercises 5 days per week...speed walking, 3-4 miles   Social Determinants of Health   Financial Resource Strain:   . Difficulty of Paying Living Expenses: Not on file  Food Insecurity:   . Worried About Charity fundraiser in the Last Year: Not on file  . Ran Out of Food in the Last Year: Not on file  Transportation Needs:   . Lack of  Transportation (Medical): Not on file  . Lack of Transportation (Non-Medical): Not on file  Physical Activity:   . Days of Exercise per Week: Not on file  . Minutes of Exercise per Session: Not on file  Stress:   . Feeling of Stress : Not on file  Social Connections:   . Frequency of Communication with Friends and Family: Not on file  . Frequency of Social Gatherings with Friends and Family: Not on file  . Attends Religious Services: Not on file  . Active Member of Clubs or Organizations: Not on file  . Attends Archivist Meetings: Not on file  . Marital Status: Not on file  Intimate Partner Violence:   . Fear of Current or Ex-Partner: Not on file  . Emotionally Abused: Not on file  . Physically Abused: Not on file  . Sexually Abused: Not on  file    Family History  Problem Relation Age of Onset  . Hypertension Mother   . Cancer Mother   . Neuropathy Mother   . Hypertension Sister   . Cancer Father   . Hypertension Maternal Grandfather   . Neuropathy Brother      Review of Systems  Constitutional: Negative.  Negative for chills and fever.  HENT: Negative.  Negative for congestion and sore throat.   Respiratory: Negative.  Negative for cough and shortness of breath.   Cardiovascular: Negative.  Negative for chest pain and palpitations.  Gastrointestinal: Negative.  Negative for abdominal pain, diarrhea, nausea and vomiting.  Genitourinary: Negative for dysuria.  Musculoskeletal: Positive for joint pain (Right elbow). Negative for myalgias.  Skin: Negative.  Negative for rash.  Neurological: Negative.  Negative for dizziness and headaches.  All other systems reviewed and are negative.    Today's Vitals   09/01/19 1052  BP: (!) 144/82  Pulse: 93  Resp: 16  Temp: 99.3 F (37.4 C)  TempSrc: Temporal  SpO2: 98%  Weight: 110 lb (49.9 kg)  Height: 5\' 5"  (1.651 m)   Body mass index is 18.3 kg/m.   Physical Exam Vitals reviewed.  Constitutional:      Appearance: Normal appearance.  HENT:     Head: Normocephalic.  Eyes:     Extraocular Movements: Extraocular movements intact.  Cardiovascular:     Rate and Rhythm: Normal rate.  Pulmonary:     Effort: Pulmonary effort is normal.  Musculoskeletal:     Cervical back: Normal range of motion.     Comments: Right elbow: Positive tenderness to lateral epicondyle.  No erythema or bruising.  Full range of motion but complaining of pain. Right wrist within normal limits Right hand within normal limits.  Neurovascularly intact. Right shoulder within normal limits  Skin:    General: Skin is warm and dry.  Neurological:     General: No focal deficit present.     Mental Status: She is alert and oriented to person, place, and time.  Psychiatric:        Mood and  Affect: Mood normal.        Behavior: Behavior normal.    DG ELBOW COMPLETE RIGHT (3+VIEW)  Result Date: 09/01/2019 CLINICAL DATA:  59 year old female with right elbow pain. EXAM: RIGHT ELBOW - COMPLETE 3+ VIEW COMPARISON:  None. FINDINGS: Bone mineralization is within normal limits. There is no evidence of fracture, dislocation, or joint effusion. Preserved joint spaces and alignment. No osseous or regional soft tissue abnormality identified. IMPRESSION: Negative radiographic appearance of the right elbow. Electronically Signed  By: Genevie Ann M.D.   On: 09/01/2019 11:27     ASSESSMENT & PLAN: Roselena was seen today for elbow pain.  Diagnoses and all orders for this visit:  Right elbow pain -     DG ELBOW COMPLETE RIGHT (3+VIEW); Future -     meloxicam (MOBIC) 7.5 MG tablet; Take 1 tablet (7.5 mg total) by mouth daily.  Right lateral epicondylitis -     methylPREDNISolone (MEDROL DOSEPAK) 4 MG TBPK tablet; Sig as indicated    Patient Instructions       If you have lab work done today you will be contacted with your lab results within the next 2 weeks.  If you have not heard from [place] then please contact us. The fastest way to get your results is to register for My Chart.   IF you received an x-ray today, you will receive an invoice from [facility]. Please contact [facility]iology at [phone] with questions or concerns regarding your invoice.   IF you received labwork today, you will receive an invoice from [place]. Please contact [facility] at [phone] with questions or concerns regarding your invoice.   Our billing staff will not be able to assist you with questions regarding bills from these companies.  You will be contacted with the lab results as soon as they are available. The fastest way to get your results is to activate your My Chart account. Instructions are located on the last page of this paperwork. If you have not heard from [place] regarding the results  in 2 weeks, please contact this office.     Tennis Elbow Tennis elbow is swelling (inflammation) in your outer forearm, near your elbow. Swelling affects the tissues that connect muscle to bone (tendons). Tennis elbow can happen in any sport or job in which you use your elbow too much. It is caused by doing the same motion over and over. Tennis elbow can cause:  Pain and tenderness in your forearm and the outer part of your elbow. You may have pain all the time, or only when using the arm.  A burning feeling. This runs from your elbow through your arm.  Weak grip in your hand. Follow these instructions at home: Activity  Rest your elbow and wrist. Avoid activities that cause problems, as told by your doctor.  If told by your doctor, wear an elbow strap to reduce stress on the area.  Do physical therapy exercises as told.  If you lift an object, lift it with your palm facing up. This is easier on your elbow. Lifestyle  If your tennis elbow is caused by sports, check your equipment and make sure that: ? You are using it correctly. ? It fits you well.  If your tennis elbow is caused by work or by using a computer, take breaks often to stretch your arm. Talk with your manager about how you can manage your condition at work. If you have a brace:  Wear the brace as told by your doctor. Remove it only as told by your doctor.  Loosen the brace if your fingers tingle, get numb, or turn cold and blue.  Keep the brace clean.  If the brace is not waterproof, ask your doctor if you may take the brace off for bathing. If you must keep the brace on while bathing: ? Do not let it get wet. ? Cover it with a watertight covering when you take a bath or a shower. General instructions   If  told, put ice on the painful area: ? Put ice in a plastic bag. ? Place a towel between your skin and the bag. ? Leave the ice on for 20 minutes, 2-3 times a day.  Take over-the-counter and prescription  medicines only as told by your doctor.  Keep all follow-up visits as told by your doctor. This is important. Contact a doctor if:  Your pain does not get better with treatment.  Your pain gets worse.  You have weakness in your forearm, hand, or fingers.  You cannot feel your forearm, hand, or fingers. Summary  Tennis elbow is swelling (inflammation) in your outer forearm, near your elbow.  Tennis elbow is caused by doing the same motion over and over.  Rest your elbow and wrist. Avoid activities that cause problems, as told by your doctor.  If told, put ice on the painful area for 20 minutes, 2-3 times a day. This information is not intended to replace advice given to you by your health care provider. Make sure you discuss any questions you have with your health care provider. Document Revised: 04/05/2018 Document Reviewed: 04/24/2017 Elsevier Patient Education  2020 Elsevier Inc.      Agustina Caroli, MD [facility]

## 2019-09-11 ENCOUNTER — Other Ambulatory Visit: Payer: Self-pay | Admitting: Family Medicine

## 2019-09-11 DIAGNOSIS — G629 Polyneuropathy, unspecified: Secondary | ICD-10-CM

## 2019-10-02 DIAGNOSIS — Z79890 Hormone replacement therapy (postmenopausal): Secondary | ICD-10-CM | POA: Diagnosis not present

## 2019-10-02 DIAGNOSIS — Z01419 Encounter for gynecological examination (general) (routine) without abnormal findings: Secondary | ICD-10-CM | POA: Diagnosis not present

## 2019-10-02 DIAGNOSIS — Z13820 Encounter for screening for osteoporosis: Secondary | ICD-10-CM | POA: Diagnosis not present

## 2019-10-02 DIAGNOSIS — Z681 Body mass index (BMI) 19 or less, adult: Secondary | ICD-10-CM | POA: Diagnosis not present

## 2019-10-14 ENCOUNTER — Ambulatory Visit: Payer: BC Managed Care – PPO | Attending: Internal Medicine

## 2019-10-14 DIAGNOSIS — Z23 Encounter for immunization: Secondary | ICD-10-CM

## 2019-10-14 NOTE — Progress Notes (Signed)
   Covid-19 Vaccination Clinic  Name:  Kellie Mason    MRN: [id number] DOB: 1961-05-12  10/14/2019  [name] was observed post Covid-19 immunization for 15 minutes without incident. She was provided with Vaccine Information Sheet and instruction to access the V-Safe system.   [name] was instructed to call 911 with any severe reactions post vaccine: Marland Kitchen Difficulty breathing  . Swelling of face and throat  . A fast heartbeat  . A bad rash all over body  . Dizziness and weakness   Immunizations Administered    Name Date Dose VIS Date Route   Moderna COVID-19 Vaccine 10/14/2019  4:30 PM 0.5 mL 06/24/2019 Intramuscular   Manufacturer: Moderna   Lot: [id number]   [id number]

## 2019-11-13 ENCOUNTER — Ambulatory Visit: Payer: BC Managed Care – PPO | Attending: Internal Medicine

## 2019-11-13 DIAGNOSIS — Z23 Encounter for immunization: Secondary | ICD-10-CM

## 2019-11-13 NOTE — Progress Notes (Signed)
   Covid-19 Vaccination Clinic  Name:  Kellie Mason    MRN: [id number] DOB: [birth year]  11/13/2019  [name] was observed post Covid-19 immunization for 15 minutes without incident. She was provided with Vaccine Information Sheet and instruction to access the V-Safe system.   [name] was instructed to call 911 with any severe reactions post vaccine: Marland Kitchen Difficulty breathing  . Swelling of face and throat  . A fast heartbeat  . A bad rash all over body  . Dizziness and weakness   Immunizations Administered    Name Date Dose VIS Date Route   Moderna COVID-19 Vaccine 11/13/2019 12:06 PM 0.5 mL 06/2019 Intramuscular   Manufacturer: Moderna   Lot: [id number]   [place][id number]

## 2020-01-27 ENCOUNTER — Telehealth: Payer: Self-pay | Admitting: Family Medicine

## 2020-01-27 DIAGNOSIS — L409 Psoriasis, unspecified: Secondary | ICD-10-CM

## 2020-01-27 NOTE — Telephone Encounter (Signed)
Requesting RF of medication not current on medication list- last ordered 2018. Attempted to call patient to inquired about request- left message she may be required to follow up for this refill.

## 2020-01-27 NOTE — Telephone Encounter (Signed)
Patient uses on hands when she has flare of skin on palms. 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 ([facility])

## 2020-01-27 NOTE — Telephone Encounter (Signed)
Please advise on this med refill request on this cream. It is not on med list and she was last seen with you on 06/30/19 and last saw Dr.Sagardia on 09/01/19.   Would you need an appointment to see her prior to refill?

## 2020-01-27 NOTE — Telephone Encounter (Signed)
Copied from [place] [id number]. Topic: Quick Communication - Rx Refill/Question >> Jan 27, 2020  8:11 AM Leward Quan A wrote: Medication: clobetasol cream (TEMOVATE) 0.05 %   Has the patient contacted their pharmacy? Yes.   (Agent: If no, request that the patient contact the pharmacy for the refill.) (Agent: If yes, when and what did the pharmacy advise?)  Preferred Pharmacy (with phone number or street name): [facility] [place], [place] - [address]  Phone:  [phone] Fax:  [phone]     Agent: Please be advised that RX refills may take up to 3 business days. We ask that you follow-up with your pharmacy.

## 2020-01-30 MED ORDER — CLOBETASOL PROPIONATE 0.05 % EX CREA: 1.0000 "application " | TOPICAL_CREAM | Freq: Two times a day (BID) | CUTANEOUS | 0 refills | Status: DC

## 2020-01-30 NOTE — Telephone Encounter (Signed)
We have discussed this prior - infrequent use. Will refill, but can discuss further at her next physical.

## 2020-02-27 DIAGNOSIS — Z85828 Personal history of other malignant neoplasm of skin: Secondary | ICD-10-CM | POA: Diagnosis not present

## 2020-02-27 DIAGNOSIS — C44519 Basal cell carcinoma of skin of other part of trunk: Secondary | ICD-10-CM | POA: Diagnosis not present

## 2020-02-27 DIAGNOSIS — L814 Other melanin hyperpigmentation: Secondary | ICD-10-CM | POA: Diagnosis not present

## 2020-02-27 DIAGNOSIS — L400 Psoriasis vulgaris: Secondary | ICD-10-CM | POA: Diagnosis not present

## 2020-03-09 ENCOUNTER — Other Ambulatory Visit: Payer: Self-pay | Admitting: Family Medicine

## 2020-03-09 DIAGNOSIS — G629 Polyneuropathy, unspecified: Secondary | ICD-10-CM

## 2020-03-09 DIAGNOSIS — I10 Essential (primary) hypertension: Secondary | ICD-10-CM

## 2020-03-09 NOTE — Telephone Encounter (Signed)
Requested medication (s) are due for refill today: yes  Requested medication (s) are on the active medication list: yes  Last refill:  12/10/2019  Future visit scheduled: no  Notes to clinic:  due for follow up visit    Requested Prescriptions  Pending Prescriptions Disp Refills   gabapentin (NEURONTIN) 300 MG capsule [Pharmacy Med Name: GABAPENTIN 300MG  CAPSULES] 90 capsule 1    Sig: TAKE 1 CAPSULE(300 MG) BY MOUTH AT BEDTIME      Neurology: Anticonvulsants - gabapentin Passed - 03/09/2020  7:02 AM      Passed - Valid encounter within last 12 months    Recent Outpatient Visits           6 months ago Right elbow pain   [facility] at [facility], Ines Bloomer, MD   8 months ago Essential hypertension   [facility] at [facility], Ranell Patrick, MD   1 year ago Plantar wart   [facility] at [facility], Delfino Lovett, NP   2 years ago B12 deficiency   [facility] at [facility], Ranell Patrick, MD   2 years ago Annual physical exam   [facility] at [facility], MD                hydrochlorothiazide (MICROZIDE) 12.5 MG capsule [Pharmacy Med Name: HYDROCHLOROTHIAZIDE 12.5MG  CAPSULES] 90 capsule 1    Sig: TAKE 1 CAPSULE(12.5 MG) BY MOUTH EVERY MORNING      Cardiovascular: Diuretics - Thiazide Failed - 03/09/2020  7:02 AM      Failed - Last BP in normal range    BP Readings from Last 1 Encounters:  09/01/19 (!) 144/82          Failed - Valid encounter within last 6 months    Recent Outpatient Visits           6 months ago Right elbow pain   [facility] at [place], Ines Bloomer, MD   8 months ago Essential hypertension   [facility] at [facility], Ranell Patrick, MD   1 year ago Plantar wart   [facility], Delfino Lovett, NP   2 years ago B12 deficiency   [facility], Ranell Patrick, MD   2 years ago Annual physical exam   [facility], Ranell Patrick, MD              Passed - Ca in normal range and  within 360 days    Calcium  Date Value Ref Range Status  06/30/2019 9.4 8.7 - 10.2 mg/dL Final          Passed - Cr in normal range and within 360 days    Creat  Date Value Ref Range Status  09/08/2015 0.74 0.50 - 1.05 mg/dL Final   Creatinine, Ser  Date Value Ref Range Status  06/30/2019 0.64 0.57 - 1.00 mg/dL Final          Passed - K in normal range and within 360 days    Potassium  Date Value Ref Range Status  06/30/2019 4.4 3.5 - 5.2 mmol/L Final          Passed - Na in normal range and within 360 days    Sodium  Date Value Ref Range Status  06/30/2019 139 134 - 144 mmol/L Final            amLODipine (NORVASC) 2.5 MG tablet [Pharmacy Med Name: AMLODIPINE BESYLATE 2.5MG  TABLETS] 90 tablet  1    Sig: TAKE 1 TABLET(2.5 MG) BY MOUTH DAILY      Cardiovascular:  Calcium Channel Blockers Failed - 03/09/2020  7:02 AM      Failed - Last BP in normal range    BP Readings from Last 1 Encounters:  09/01/19 (!) 144/82          Failed - Valid encounter within last 6 months    Recent Outpatient Visits           6 months ago Right elbow pain   Primary Care at [facility], Ines Bloomer, MD   8 months ago Essential hypertension   [facility] at [facility], Ranell Patrick, MD   1 year ago Plantar wart   [facility] at [facility], Delfino Lovett, NP   2 years ago B12 deficiency   [facility] at [facility], Ranell Patrick, MD   2 years ago Annual physical exam   [facility] at [facility], Ranell Patrick, MD

## 2020-03-09 NOTE — Telephone Encounter (Signed)
Please schedule a f/u appt with patient for med refills

## 2020-03-09 NOTE — Telephone Encounter (Signed)
I sent an message to the scheduling pool to get patient an appt but when I was trying to refill this medication it is asking for an formulated rx

## 2020-03-09 NOTE — Telephone Encounter (Signed)
Called pt and informed them of need for appt. Pt. Was unable to schedule at the time of the call. Pt. Asserted they understood and would call back

## 2020-03-09 NOTE — Telephone Encounter (Signed)
Agree - appt needed - refilled temporarily.

## 2020-03-16 DIAGNOSIS — L308 Other specified dermatitis: Secondary | ICD-10-CM | POA: Diagnosis not present

## 2020-05-05 DIAGNOSIS — L308 Other specified dermatitis: Secondary | ICD-10-CM | POA: Diagnosis not present

## 2020-05-05 DIAGNOSIS — D0471 Carcinoma in situ of skin of right lower limb, including hip: Secondary | ICD-10-CM | POA: Diagnosis not present

## 2020-06-01 ENCOUNTER — Ambulatory Visit (INDEPENDENT_AMBULATORY_CARE_PROVIDER_SITE_OTHER): Payer: BC Managed Care – PPO

## 2020-06-01 ENCOUNTER — Encounter: Payer: Self-pay | Admitting: Registered Nurse

## 2020-06-01 ENCOUNTER — Other Ambulatory Visit: Payer: Self-pay

## 2020-06-01 ENCOUNTER — Ambulatory Visit (INDEPENDENT_AMBULATORY_CARE_PROVIDER_SITE_OTHER): Payer: BC Managed Care – PPO | Admitting: Registered Nurse

## 2020-06-01 VITALS — BP 153/94 | HR 86 | Temp 98.8°F | Resp 18 | Ht 65.0 in | Wt 106.2 lb

## 2020-06-01 DIAGNOSIS — M79641 Pain in right hand: Secondary | ICD-10-CM | POA: Diagnosis not present

## 2020-06-01 DIAGNOSIS — S62356A Nondisplaced fracture of shaft of fifth metacarpal bone, right hand, initial encounter for closed fracture: Secondary | ICD-10-CM | POA: Diagnosis not present

## 2020-06-01 NOTE — Patient Instructions (Signed)
° ° ° °  If you have lab work done today you will be contacted with your lab results within the next 2 weeks.  If you have not heard from us then please contact us. The fastest way to get your results is to register for My Chart. ° ° °IF you received an x-ray today, you will receive an invoice from [facility]. Please contact [facility] at [phone] with questions or concerns regarding your invoice.  ° °IF you received labwork today, you will receive an invoice from [facility]. Please contact [facility] at [phone] with questions or concerns regarding your invoice.  ° °Our billing staff will not be able to assist you with questions regarding bills from these companies. ° °You will be contacted with the lab results as soon as they are available. The fastest way to get your results is to activate your My Chart account. Instructions are located on the last page of this paperwork. If you have not heard from us regarding the results in 2 weeks, please contact this office. °  ° ° ° °

## 2020-06-01 NOTE — Progress Notes (Signed)
Acute Office Visit  Subjective:    Patient ID: Kellie Mason, female    DOB: 1960-10-16, 59 y.o.   MRN: [id number]  Chief Complaint  Patient presents with  . Hand Pain    Patient states she was in the bathroom and was lifting the toilet seat and it got caught in between the ring and pinky finger. Per patient she heard a crack when this happened and still in some pain    HPI Patient is in today for fall  Occurred in bathroom. Was rushing and caught pinky finger of R hand on toilet seat. Heard a crack  This was eight days ago. Still swelling and pain. 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 Still fairly uncomfortable at times though Concern that she broke her hand. No numbness or tingling, no discoloration of fingers.   Past Medical History:  Diagnosis Date  . Arthritis   . Basal cell carcinoma of anterior chest    shave bx - 08/12/12 - Dr. Wilhemina Bonito.  . Chest pain   . Cough   . Dizziness - light-headed   . Headache   . Hypertension   . Palpitations   . Tobacco abuse    No ready to quit    Past Surgical History:  Procedure Laterality Date  . ELECTROCARDIOGRAM    . KNEE ARTHROSCOPY Left     Family History  Problem Relation Age of Onset  . Hypertension Mother   . Cancer Mother   . Neuropathy Mother   . Hypertension Sister   . Cancer Father   . Hypertension Maternal Grandfather   . Neuropathy Brother     Social History   Socioeconomic History  . Marital status: Married    Spouse name: Not on file  . Number of children: Not on file  . Years of education: Not on file  . Highest education level: Not on file  Occupational History  . Occupation: Works for Education officer, community    Comment: Health and safety inspector  Tobacco Use  . Smoking status: Current Every Day Smoker    Packs/day: 0.50  . Smokeless tobacco: Never Used  Substance and Sexual Activity  . Alcohol use: Yes    Alcohol/week: 2.0 standard drinks    Types: 2  Standard drinks or equivalent per week  . Drug use: No  . Sexual activity: Yes  Other Topics Concern  . Not on file  Social History Narrative   Married. Exercises 5 days per week...speed walking, 3-4 miles   Social Determinants of Health   Financial Resource Strain:   . Difficulty of Paying Living Expenses: Not on file  Food Insecurity:   . Worried About Charity fundraiser in the Last Year: Not on file  . Ran Out of Food in the Last Year: Not on file  Transportation Needs:   . Lack of Transportation (Medical): Not on file  . Lack of Transportation (Non-Medical): Not on file  Physical Activity:   . Days of Exercise per Week: Not on file  . Minutes of Exercise per Session: Not on file  Stress:   . Feeling of Stress : Not on file  Social Connections:   . Frequency of Communication with Friends and Family: Not on file  . Frequency of Social Gatherings with Friends and Family: Not on file  . Attends Religious Services: Not on file  . Active Member of Clubs or Organizations: Not on file  .  Attends Archivist Meetings: Not on file  . Marital Status: Not on file  Intimate Partner Violence:   . Fear of Current or Ex-Partner: Not on file  . Emotionally Abused: Not on file  . Physically Abused: Not on file  . Sexually Abused: Not on file    Outpatient Medications Prior to Visit  Medication Sig Dispense Refill  . amLODipine (NORVASC) 2.5 MG tablet TAKE 1 TABLET(2.5 MG) BY MOUTH DAILY 90 tablet 0  . Calcium Carbonate-Vit D-Min (CALTRATE PLUS PO) Take by mouth.      . clobetasol cream (TEMOVATE) 9.56 % Apply 1 application topically 2 (two) times daily. As needed. 15 g 0  . estradiol (ESTRACE) 0.5 MG tablet Take 0.5 mg by mouth daily.    Marland Kitchen gabapentin (NEURONTIN) 300 MG capsule TAKE 1 CAPSULE(300 MG) BY MOUTH AT BEDTIME 90 capsule 1  . hydrochlorothiazide (MICROZIDE) 12.5 MG capsule TAKE 1 CAPSULE(12.5 MG) BY MOUTH EVERY MORNING 90 capsule 0  . meloxicam (MOBIC) 7.5 MG tablet  Take 1 tablet (7.5 mg total) by mouth daily. 30 tablet 0  . methylPREDNISolone (MEDROL DOSEPAK) 4 MG TBPK tablet Sig as indicated 21 tablet 1  . Progesterone Micronized (PROGESTERONE PO) Take by mouth.    . vitamin B-12 (CYANOCOBALAMIN) 1000 MCG tablet Take 1,000 mcg by mouth daily.     No facility-administered medications prior to visit.    Allergies  Allergen Reactions  . Tetracyclines & Related   . Codeine Rash    Review of Systems Per hpi      Objective:    Physical Exam Vitals and nursing note reviewed.  Constitutional:      Appearance: Normal appearance.  Cardiovascular:     Rate and Rhythm: Normal rate and regular rhythm.  Musculoskeletal:        General: Swelling, tenderness and signs of injury present.     Right hand: Swelling, tenderness and bony tenderness present. No deformity or lacerations. Decreased range of motion. Decreased strength of finger abduction and thumb/finger opposition. Normal strength of wrist extension. Normal sensation. There is no disruption of two-point discrimination. Normal capillary refill. Normal pulse.  Skin:    General: Skin is warm.     Capillary Refill: Capillary refill takes less than 2 seconds.     Coloration: Skin is not jaundiced or pale.     Findings: Bruising and erythema present. No lesion or rash.  Neurological:     General: No focal deficit present.     Mental Status: She is alert and oriented to person, place, and time. Mental status is at baseline.  Psychiatric:        Mood and Affect: Mood normal.        Behavior: Behavior normal.        Thought Content: Thought content normal.        Judgment: Judgment normal.     BP (!) 153/94   Pulse 86   Temp 98.8 F (37.1 C) (Temporal)   Resp 18   Ht 5\' 5"  (1.651 m)   Wt 106 lb 3.2 oz (48.2 kg)   SpO2 98%   BMI 17.67 kg/m  Wt Readings from Last 3 Encounters:  06/01/20 106 lb 3.2 oz (48.2 kg)  09/01/19 110 lb (49.9 kg)  06/30/19 113 lb 3.2 oz (51.3 kg)    There are  no preventive care reminders to display for this patient.  There are no preventive care reminders to display for this patient.   Lab Results  Component  Value Date   TSH 4.250 01/03/2016   Lab Results  Component Value Date   WBC 9.9 01/09/2017   HGB 14.2 01/09/2017   HCT 40.2 01/09/2017   MCV 90.1 01/09/2017   PLT 405 (H) 01/03/2016   Lab Results  Component Value Date   NA 139 06/30/2019   K 4.4 06/30/2019   CO2 16 (L) 06/30/2019   GLUCOSE 87 06/30/2019   BUN 13 06/30/2019   CREATININE 0.64 06/30/2019   BILITOT 0.5 06/30/2019   ALKPHOS 66 06/30/2019   AST 14 06/30/2019   ALT 9 06/30/2019   PROT 6.8 06/30/2019   ALBUMIN 4.7 06/30/2019   CALCIUM 9.4 06/30/2019   Lab Results  Component Value Date   CHOL 230 (H) 06/30/2019   Lab Results  Component Value Date   HDL 97 06/30/2019   Lab Results  Component Value Date   LDLCALC 114 (H) 06/30/2019   Lab Results  Component Value Date   TRIG 111 06/30/2019   Lab Results  Component Value Date   CHOLHDL 2.4 06/30/2019   Lab Results  Component Value Date   HGBA1C 5.3 01/03/2016       Assessment & Plan:   Problem List Items Addressed This Visit    None    Visit Diagnoses    Pain of right hand    -  Primary   Relevant Orders   DG Hand Complete Right (Completed)   Nondisplaced fracture of shaft of fifth metacarpal bone, right hand, initial encounter for closed fracture       Relevant Orders   Ambulatory referral to Hand Surgery       No orders of the defined types were placed in this encounter.  PLAN  Fracture of fifth metacarpal apparent on xray  Will refer to hand surgery for further assessment  Pt declines pain management  Neurovascularly intact at this time.  Patient encouraged to call clinic with any questions, comments, or concerns.   Maximiano Coss, NP

## 2020-06-02 ENCOUNTER — Telehealth: Payer: Self-pay | Admitting: Family Medicine

## 2020-06-02 NOTE — Telephone Encounter (Signed)
I have attempted to call pt no answer and I was unable to leave message.

## 2020-06-02 NOTE — Telephone Encounter (Signed)
Pt called stating that she was seen in office by Maximiano Coss. She states that he asked if she needed pain medication for her broken finger and she stated that she didn't, but after cleaning a house yesterday she is experiencing more pain and swelling. Pt is requesting to have a mild pain medication sent in for her. Please advise.       [facility] [place], [address] AT [address]  [place] [address]  Phone: [phone] Fax: [phone]  Hours: Not open 24 hours

## 2020-06-07 ENCOUNTER — Other Ambulatory Visit: Payer: Self-pay

## 2020-06-07 ENCOUNTER — Ambulatory Visit: Payer: BC Managed Care – PPO | Admitting: Family Medicine

## 2020-06-07 ENCOUNTER — Encounter: Payer: Self-pay | Admitting: Orthopaedic Surgery

## 2020-06-07 ENCOUNTER — Other Ambulatory Visit: Payer: Self-pay | Admitting: Family Medicine

## 2020-06-07 ENCOUNTER — Ambulatory Visit (INDEPENDENT_AMBULATORY_CARE_PROVIDER_SITE_OTHER): Payer: BC Managed Care – PPO | Admitting: Orthopaedic Surgery

## 2020-06-07 VITALS — Ht 65.0 in | Wt 106.0 lb

## 2020-06-07 DIAGNOSIS — S62326A Displaced fracture of shaft of fifth metacarpal bone, right hand, initial encounter for closed fracture: Secondary | ICD-10-CM | POA: Diagnosis not present

## 2020-06-07 DIAGNOSIS — I10 Essential (primary) hypertension: Secondary | ICD-10-CM

## 2020-06-07 NOTE — Progress Notes (Signed)
The patient is a very pleasant 59 year old right-hand-dominant female who is 15 days into an injury of her right hand.  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  She had her right hand crushed between a toilet seat recently.  X-rays were obtained last week that showed 5th metacarpal shaft fracture.  She is not treated this with anything it other than just a Velcro wrist splint.  She only reports mild pain.  She is never injured this hand before.  She denies any numbness and tingling.  She also denies any acute change in her medical status.  She has been using her left hand more for work with her [place].  She currently denies any headache, chest pain, shortness of breath, fever, chills, nausea, vomiting She is alert and orient x3 and in no acute distress  Examination of her right hand does show some pain over the fifth metacarpal but there is no rotational deformity at all when she flexes and extends at the MCP or IP joints.  There is no shortening either.  She is neurovascularly intact.  X-rays from last week of the right hand and fifth metacarpal show a shaft fracture that is oblique of the fifth metacarpal.  She is 2 weeks into this injury and it is showing no rotational deformity and is a stable fracture.  We can treat her in a Velcro ulnar gutter wrist splint.  She will remove it for comfort and hygiene purposes.  I would like to see her back in 2 weeks with a repeat 3 views of her right hand.  All question concerns were answered and addressed.

## 2020-06-11 ENCOUNTER — Other Ambulatory Visit: Payer: Self-pay

## 2020-06-11 ENCOUNTER — Ambulatory Visit: Payer: BC Managed Care – PPO | Admitting: Registered Nurse

## 2020-06-11 ENCOUNTER — Encounter: Payer: Self-pay | Admitting: Registered Nurse

## 2020-06-11 VITALS — BP 121/80 | HR 86 | Temp 98.0°F | Resp 18 | Ht 65.0 in | Wt 102.4 lb

## 2020-06-11 DIAGNOSIS — I10 Essential (primary) hypertension: Secondary | ICD-10-CM

## 2020-06-11 DIAGNOSIS — Z23 Encounter for immunization: Secondary | ICD-10-CM | POA: Diagnosis not present

## 2020-06-11 NOTE — Patient Instructions (Signed)
° ° ° °  If you have lab work done today you will be contacted with your lab results within the next 2 weeks.  If you have not heard from us then please contact us. The fastest way to get your results is to register for My Chart. ° ° °IF you received an x-ray today, you will receive an invoice from [facility]. Please contact [facility] at [phone] with questions or concerns regarding your invoice.  ° °IF you received labwork today, you will receive an invoice from [facility]. Please contact [facility] at [phone] with questions or concerns regarding your invoice.  ° °Our billing staff will not be able to assist you with questions regarding bills from these companies. ° °You will be contacted with the lab results as soon as they are available. The fastest way to get your results is to activate your My Chart account. Instructions are located on the last page of this paperwork. If you have not heard from us regarding the results in 2 weeks, please contact this office. °  ° ° ° °

## 2020-06-11 NOTE — Telephone Encounter (Signed)
Pt is coming to the office today and we will get an update from her.

## 2020-06-21 ENCOUNTER — Ambulatory Visit: Payer: BC Managed Care – PPO | Admitting: Orthopaedic Surgery

## 2020-06-23 ENCOUNTER — Ambulatory Visit: Payer: BC Managed Care – PPO | Admitting: Orthopaedic Surgery

## 2020-06-23 ENCOUNTER — Ambulatory Visit (INDEPENDENT_AMBULATORY_CARE_PROVIDER_SITE_OTHER): Payer: BC Managed Care – PPO

## 2020-06-23 ENCOUNTER — Encounter: Payer: Self-pay | Admitting: Orthopaedic Surgery

## 2020-06-23 DIAGNOSIS — S62326D Displaced fracture of shaft of fifth metacarpal bone, right hand, subsequent encounter for fracture with routine healing: Secondary | ICD-10-CM | POA: Diagnosis not present

## 2020-06-23 NOTE — Progress Notes (Signed)
The patient is a right-hand-dominant female who is now 4 weeks into an injury to her right dominant hand at the fifth metacarpal.  This was a shaft fracture.  There is slight angulation to this.  We treated her in a ulnar gutter splint.  On exam today there is no rotational deformity of her hand or fingers.  There is some slight palmar displacement of the MCP joint as far as the metacarpal head but she is able to make a full fist and straighten her fingers easily.  I assessed the fracture site and you can tell where she is swollen of the midshaft of the fifth metacarpal on the right hand but there is no instability that I can feel the fracture itself.  3 views of the hand are reviewed and there is some slight angulation that is increased from her previous films from several weeks ago.  Its overall though in acceptable alignment when you look at her clinically.  At this point we will still treat her just in her ulnar gutter splint with coming in and out of it as comfort allows.  I would like to see her back in 4 weeks with a repeat 3 views of her right hand.  Based on her clinical exam would not recommend surgery.  She is also a smoker.

## 2020-06-30 DIAGNOSIS — Z85828 Personal history of other malignant neoplasm of skin: Secondary | ICD-10-CM | POA: Diagnosis not present

## 2020-06-30 DIAGNOSIS — L820 Inflamed seborrheic keratosis: Secondary | ICD-10-CM | POA: Diagnosis not present

## 2020-06-30 DIAGNOSIS — L300 Nummular dermatitis: Secondary | ICD-10-CM | POA: Diagnosis not present

## 2020-06-30 DIAGNOSIS — L308 Other specified dermatitis: Secondary | ICD-10-CM | POA: Diagnosis not present

## 2020-06-30 DIAGNOSIS — L400 Psoriasis vulgaris: Secondary | ICD-10-CM | POA: Diagnosis not present

## 2020-07-05 ENCOUNTER — Telehealth: Payer: Self-pay

## 2020-07-05 NOTE — Telephone Encounter (Signed)
Received report from [facility] about the biopsy of forarm. Report given to provider to look over. Orland Mustard)

## 2020-07-14 DIAGNOSIS — Z79899 Other long term (current) drug therapy: Secondary | ICD-10-CM | POA: Diagnosis not present

## 2020-07-14 DIAGNOSIS — L2089 Other atopic dermatitis: Secondary | ICD-10-CM | POA: Diagnosis not present

## 2020-07-21 ENCOUNTER — Ambulatory Visit: Payer: BC Managed Care – PPO | Admitting: Orthopaedic Surgery

## 2020-07-21 DIAGNOSIS — L400 Psoriasis vulgaris: Secondary | ICD-10-CM | POA: Diagnosis not present

## 2020-07-21 DIAGNOSIS — Z79899 Other long term (current) drug therapy: Secondary | ICD-10-CM | POA: Diagnosis not present

## 2020-08-04 ENCOUNTER — Encounter: Payer: Self-pay | Admitting: Orthopaedic Surgery

## 2020-08-04 ENCOUNTER — Ambulatory Visit: Payer: BC Managed Care – PPO | Admitting: Orthopaedic Surgery

## 2020-08-04 ENCOUNTER — Ambulatory Visit (INDEPENDENT_AMBULATORY_CARE_PROVIDER_SITE_OTHER): Payer: BC Managed Care – PPO

## 2020-08-04 DIAGNOSIS — S62326D Displaced fracture of shaft of fifth metacarpal bone, right hand, subsequent encounter for fracture with routine healing: Secondary | ICD-10-CM | POA: Diagnosis not present

## 2020-08-04 NOTE — Progress Notes (Signed)
The patient is between 8 and 9 weeks status post a right hand fifth metacarpal shaft fracture.  Although there was apex dorsal angulation of the fracture we elected to treat this nonoperatively because Kellie Mason has no rotational deformity.  Kellie Mason says Kellie Mason is doing very well overall but Kellie Mason may have had a setback last week when Kellie Mason hit her hand against a door.  Kellie Mason continues to show no rotation deformity with full flexion extension of all the digits of her right hand.  There is no motion at the fracture site but there is definitely some angulation.  3 views of the right hand show abundant healing of the metacarpal shaft fracture with apex dorsal angulation but overall acceptable alignment and no rotational deformity.  Kellie Mason will continue to increase her activities but being careful with that hand with heavy gripping.  I like to see her back for final visit in about 6 weeks for the final 3 views of the right hand.

## 2020-08-11 DIAGNOSIS — Z79899 Other long term (current) drug therapy: Secondary | ICD-10-CM | POA: Diagnosis not present

## 2020-08-11 DIAGNOSIS — L2089 Other atopic dermatitis: Secondary | ICD-10-CM | POA: Diagnosis not present

## 2020-08-18 ENCOUNTER — Encounter: Payer: Self-pay | Admitting: Registered Nurse

## 2020-08-18 NOTE — Progress Notes (Signed)
Established Patient Office Visit  Subjective:  Patient ID: Kellie Mason, female    DOB: May 01, 1961  Age: 60 y.o. MRN: [id number]  CC:  Chief Complaint  Patient presents with  . Follow-up    Patient states she is here for medication refill and also a follow up.    HPI Kellie Mason presents for htn follow up and flu shot  Hypertension: Patient Currently taking: hctz 12.5mg  PO qd Good effect. No AEs. Denies CV symptoms including: chest pain, shob, doe, headache, visual changes, fatigue, claudication, and dependent edema.   Previous readings and labs: BP Readings from Last 3 Encounters:  06/11/20 121/80  06/01/20 (!) 153/94  09/01/19 (!) 144/82   Lab Results  Component Value Date   CREATININE 0.64 06/30/2019      Past Medical History:  Diagnosis Date  . Arthritis   . Basal cell carcinoma of anterior chest    shave bx - 08/12/12 - Dr. Wilhemina Bonito.  . Chest pain   . Cough   . Dizziness - light-headed   . Headache   . Hypertension   . Palpitations   . Tobacco abuse    No ready to quit    Past Surgical History:  Procedure Laterality Date  . ELECTROCARDIOGRAM    . KNEE ARTHROSCOPY Left     Family History  Problem Relation Age of Onset  . Hypertension Mother   . Cancer Mother   . Neuropathy Mother   . Hypertension Sister   . Cancer Father   . Hypertension Maternal Grandfather   . Neuropathy Brother     Social History   Socioeconomic History  . Marital status: Married    Spouse name: Not on file  . Number of children: Not on file  . Years of education: Not on file  . Highest education level: Not on file  Occupational History  . Occupation: Works for Education officer, community    Comment: Health and safety inspector  Tobacco Use  . Smoking status: Current Every Day Smoker    Packs/day: 0.50  . Smokeless tobacco: Never Used  Substance and Sexual Activity  . Alcohol use: Yes    Alcohol/week: 2.0 standard drinks    Types: 2 Standard drinks or equivalent per week   . Drug use: No  . Sexual activity: Yes  Other Topics Concern  . Not on file  Social History Narrative   Married. Exercises 5 days per week...speed walking, 3-4 miles   Social Determinants of Health   Financial Resource Strain: Not on file  Food Insecurity: Not on file  Transportation Needs: Not on file  Physical Activity: Not on file  Stress: Not on file  Social Connections: Not on file  Intimate Partner Violence: Not on file    Outpatient Medications Prior to Visit  Medication Sig Dispense Refill  . amLODipine (NORVASC) 2.5 MG tablet TAKE 1 TABLET(2.5 MG) BY MOUTH DAILY; NEED OFFICE VISIT 90 tablet 0  . Calcium Carbonate-Vit D-Min (CALTRATE PLUS PO) Take by mouth.      . clobetasol cream (TEMOVATE) 5.17 % Apply 1 application topically 2 (two) times daily. As needed. 15 g 0  . estradiol (ESTRACE) 0.5 MG tablet Take 0.5 mg by mouth daily.    Marland Kitchen gabapentin (NEURONTIN) 300 MG capsule TAKE 1 CAPSULE(300 MG) BY MOUTH AT BEDTIME 90 capsule 1  . hydrochlorothiazide (MICROZIDE) 12.5 MG capsule TAKE 1 CAPSULE(12.5 MG) BY MOUTH EVERY MORNING; NEED OFFICE VISIT 90 capsule 0  . meloxicam (MOBIC) 7.5 MG tablet Take  1 tablet (7.5 mg total) by mouth daily. 30 tablet 0  . methylPREDNISolone (MEDROL DOSEPAK) 4 MG TBPK tablet Sig as indicated 21 tablet 1  . Progesterone Micronized (PROGESTERONE PO) Take by mouth.    . vitamin B-12 (CYANOCOBALAMIN) 1000 MCG tablet Take 1,000 mcg by mouth daily.     No facility-administered medications prior to visit.    Allergies  Allergen Reactions  . Tetracyclines & Related   . Codeine Rash    ROS Review of Systems  Constitutional: Negative.   HENT: Negative.   Eyes: Negative.   Respiratory: Negative.   Cardiovascular: Negative.   Gastrointestinal: Negative.   Genitourinary: Negative.   Musculoskeletal: Negative.   Skin: Negative.   Neurological: Negative.   Psychiatric/Behavioral: Negative.   All other systems reviewed and are negative.      Objective:    Physical Exam Vitals and nursing note reviewed.  Constitutional:      General: She is not in acute distress.    Appearance: Normal appearance. She is normal weight. She is not ill-appearing, toxic-appearing or diaphoretic.  Cardiovascular:     Rate and Rhythm: Normal rate and regular rhythm.     Heart sounds: Normal heart sounds. No murmur heard. No friction rub. No gallop.   Pulmonary:     Effort: Pulmonary effort is normal. No respiratory distress.     Breath sounds: Normal breath sounds. No stridor. No wheezing, rhonchi or rales.  Chest:     Chest wall: No tenderness.  Skin:    General: Skin is warm and dry.  Neurological:     General: No focal deficit present.     Mental Status: She is alert and oriented to person, place, and time. Mental status is at baseline.  Psychiatric:        Mood and Affect: Mood normal.        Behavior: Behavior normal.        Thought Content: Thought content normal.        Judgment: Judgment normal.     BP 121/80   Pulse 86   Temp 98 F (36.7 C) (Temporal)   Resp 18   Ht 5\' 5"  (1.651 m)   Wt 102 lb 6.4 oz (46.4 kg)   SpO2 97%   BMI 17.04 kg/m  Wt Readings from Last 3 Encounters:  06/11/20 102 lb 6.4 oz (46.4 kg)  06/07/20 106 lb (48.1 kg)  06/01/20 106 lb 3.2 oz (48.2 kg)     Health Maintenance Due  Topic Date Due  . PAP SMEAR-Modifier  03/22/2018  . COVID-19 Vaccine (3 - Moderna risk 4-dose series) 12/11/2019    There are no preventive care reminders to display for this patient.  Lab Results  Component Value Date   TSH 4.250 01/03/2016   Lab Results  Component Value Date   WBC 9.9 01/09/2017   HGB 14.2 01/09/2017   HCT 40.2 01/09/2017   MCV 90.1 01/09/2017   PLT 405 (H) 01/03/2016   Lab Results  Component Value Date   NA 139 06/30/2019   K 4.4 06/30/2019   CO2 16 (L) 06/30/2019   GLUCOSE 87 06/30/2019   BUN 13 06/30/2019   CREATININE 0.64 06/30/2019   BILITOT 0.5 06/30/2019   ALKPHOS 66  06/30/2019   AST 14 06/30/2019   ALT 9 06/30/2019   PROT 6.8 06/30/2019   ALBUMIN 4.7 06/30/2019   CALCIUM 9.4 06/30/2019   Lab Results  Component Value Date   CHOL 230 (H) 06/30/2019  Lab Results  Component Value Date   HDL 97 06/30/2019   Lab Results  Component Value Date   LDLCALC 114 (H) 06/30/2019   Lab Results  Component Value Date   TRIG 111 06/30/2019   Lab Results  Component Value Date   CHOLHDL 2.4 06/30/2019   Lab Results  Component Value Date   HGBA1C 5.3 01/03/2016      Assessment & Plan:   Problem List Items Addressed This Visit   None   Visit Diagnoses    Essential hypertension    -  Primary   Flu vaccine need       Relevant Orders   Flu Vaccine QUAD 6+ mos PF IM (Fluarix Quad PF) (Completed)      No orders of the defined types were placed in this encounter.   Follow-up: No follow-ups on file.   PLAN  Flu shot given  Refill x 6 mo  Return for med check  Patient encouraged to call clinic with any questions, comments, or concerns.  Maximiano Coss, NP

## 2020-09-06 ENCOUNTER — Other Ambulatory Visit: Payer: Self-pay | Admitting: Family Medicine

## 2020-09-06 DIAGNOSIS — G629 Polyneuropathy, unspecified: Secondary | ICD-10-CM

## 2020-09-06 DIAGNOSIS — I10 Essential (primary) hypertension: Secondary | ICD-10-CM

## 2020-09-06 MED ORDER — HYDROCHLOROTHIAZIDE 12.5 MG PO CAPS: ORAL_CAPSULE | ORAL | 0 refills | Status: DC

## 2020-09-15 ENCOUNTER — Ambulatory Visit: Payer: BC Managed Care – PPO | Admitting: Orthopaedic Surgery

## 2020-09-20 DIAGNOSIS — Z79899 Other long term (current) drug therapy: Secondary | ICD-10-CM | POA: Diagnosis not present

## 2020-09-20 DIAGNOSIS — L2089 Other atopic dermatitis: Secondary | ICD-10-CM | POA: Diagnosis not present

## 2020-10-18 DIAGNOSIS — Z85828 Personal history of other malignant neoplasm of skin: Secondary | ICD-10-CM | POA: Diagnosis not present

## 2020-10-18 DIAGNOSIS — L570 Actinic keratosis: Secondary | ICD-10-CM | POA: Diagnosis not present

## 2020-10-18 DIAGNOSIS — L2089 Other atopic dermatitis: Secondary | ICD-10-CM | POA: Diagnosis not present

## 2020-10-18 DIAGNOSIS — Z79899 Other long term (current) drug therapy: Secondary | ICD-10-CM | POA: Diagnosis not present

## 2020-11-15 DIAGNOSIS — Z681 Body mass index (BMI) 19 or less, adult: Secondary | ICD-10-CM | POA: Diagnosis not present

## 2020-11-15 DIAGNOSIS — Z01419 Encounter for gynecological examination (general) (routine) without abnormal findings: Secondary | ICD-10-CM | POA: Diagnosis not present

## 2020-11-15 DIAGNOSIS — Z1231 Encounter for screening mammogram for malignant neoplasm of breast: Secondary | ICD-10-CM | POA: Diagnosis not present

## 2020-11-23 DIAGNOSIS — L2089 Other atopic dermatitis: Secondary | ICD-10-CM | POA: Diagnosis not present

## 2020-11-23 DIAGNOSIS — C44529 Squamous cell carcinoma of skin of other part of trunk: Secondary | ICD-10-CM | POA: Diagnosis not present

## 2020-11-23 DIAGNOSIS — L820 Inflamed seborrheic keratosis: Secondary | ICD-10-CM | POA: Diagnosis not present

## 2020-11-23 DIAGNOSIS — Z85828 Personal history of other malignant neoplasm of skin: Secondary | ICD-10-CM | POA: Diagnosis not present

## 2020-12-04 ENCOUNTER — Other Ambulatory Visit: Payer: Self-pay | Admitting: Family Medicine

## 2020-12-04 DIAGNOSIS — G629 Polyneuropathy, unspecified: Secondary | ICD-10-CM

## 2020-12-04 DIAGNOSIS — I10 Essential (primary) hypertension: Secondary | ICD-10-CM

## 2020-12-22 DIAGNOSIS — B078 Other viral warts: Secondary | ICD-10-CM | POA: Diagnosis not present

## 2020-12-22 DIAGNOSIS — L570 Actinic keratosis: Secondary | ICD-10-CM | POA: Diagnosis not present

## 2020-12-22 DIAGNOSIS — Z85828 Personal history of other malignant neoplasm of skin: Secondary | ICD-10-CM | POA: Diagnosis not present

## 2020-12-22 DIAGNOSIS — C44529 Squamous cell carcinoma of skin of other part of trunk: Secondary | ICD-10-CM | POA: Diagnosis not present

## 2020-12-22 DIAGNOSIS — L2089 Other atopic dermatitis: Secondary | ICD-10-CM | POA: Diagnosis not present

## 2021-03-04 ENCOUNTER — Other Ambulatory Visit: Payer: Self-pay | Admitting: Family Medicine

## 2021-03-04 DIAGNOSIS — I10 Essential (primary) hypertension: Secondary | ICD-10-CM

## 2021-03-04 DIAGNOSIS — G629 Polyneuropathy, unspecified: Secondary | ICD-10-CM

## 2021-03-07 DIAGNOSIS — L2089 Other atopic dermatitis: Secondary | ICD-10-CM | POA: Diagnosis not present

## 2021-03-07 DIAGNOSIS — Z85828 Personal history of other malignant neoplasm of skin: Secondary | ICD-10-CM | POA: Diagnosis not present

## 2021-03-07 DIAGNOSIS — L821 Other seborrheic keratosis: Secondary | ICD-10-CM | POA: Diagnosis not present

## 2021-03-07 DIAGNOSIS — D225 Melanocytic nevi of trunk: Secondary | ICD-10-CM | POA: Diagnosis not present

## 2021-03-07 DIAGNOSIS — B078 Other viral warts: Secondary | ICD-10-CM | POA: Diagnosis not present

## 2021-03-09 ENCOUNTER — Telehealth: Payer: BC Managed Care – PPO | Admitting: Family Medicine

## 2021-03-09 ENCOUNTER — Encounter: Payer: Self-pay | Admitting: Family Medicine

## 2021-03-09 DIAGNOSIS — G629 Polyneuropathy, unspecified: Secondary | ICD-10-CM

## 2021-03-09 DIAGNOSIS — I10 Essential (primary) hypertension: Secondary | ICD-10-CM | POA: Diagnosis not present

## 2021-03-09 DIAGNOSIS — E785 Hyperlipidemia, unspecified: Secondary | ICD-10-CM

## 2021-03-09 DIAGNOSIS — E538 Deficiency of other specified B group vitamins: Secondary | ICD-10-CM

## 2021-03-09 DIAGNOSIS — F17210 Nicotine dependence, cigarettes, uncomplicated: Secondary | ICD-10-CM

## 2021-03-09 DIAGNOSIS — L309 Dermatitis, unspecified: Secondary | ICD-10-CM

## 2021-03-09 MED ORDER — AMLODIPINE BESYLATE 2.5 MG PO TABS: ORAL_TABLET | ORAL | 1 refills | Status: DC

## 2021-03-09 MED ORDER — HYDROCHLOROTHIAZIDE 12.5 MG PO CAPS: ORAL_CAPSULE | ORAL | 1 refills | Status: DC

## 2021-03-09 MED ORDER — GABAPENTIN 300 MG PO CAPS: ORAL_CAPSULE | ORAL | 1 refills | Status: DC

## 2021-03-09 NOTE — Progress Notes (Signed)
Virtual Visit via Video Note  I connected with Kellie Mason on 03/09/21 at 5:35 PM by a video enabled telemedicine application and verified that I am speaking with the correct person using two identifiers.  Patient location: home My location: office - [place].    I discussed the limitations, risks, security and privacy concerns of performing an evaluation and management service by telephone and the availability of in person appointments. I also discussed with the patient that there may be a patient responsible charge related to this service. The patient expressed understanding and agreed to proceed, consent obtained  Chief complaint:  Chief Complaint  Patient presents with   Hypertension    Pt is in need of amlodipine, hctz refills, pt denies physical sxs.    Peripheral Neuropathy    Needs refill of gabapentin    History of Present Illness: Kellie Mason is a 60 y.o. female  Hypertension: Last visit in November 2021 with my colleague.  HCTZ 12.5 mg daily, amlodipine 2.5 mg daily  Still taking hctz 12.'5mg'$  qd. No new side effects  No home readings.  No labs since December 2020. Home readings: BP Readings from Last 3 Encounters:  06/11/20 121/80  06/01/20 (!) 153/94  09/01/19 (!) 144/82   Lab Results  Component Value Date   CREATININE 0.64 06/30/2019   Constitutional: Negative for fatigue and unexpected weight change.  Eyes: Negative for visual disturbance.  Respiratory: Negative for cough, chest tightness and shortness of breath.   Cardiovascular: Negative for chest pain, palpitations and leg swelling.  Gastrointestinal: Negative for abdominal pain and blood in stool.  Neurological: Negative for dizziness, light-headedness and headaches.      Hyperlipidemia: Elevated readings in 2020 with borderline ASCVD risk score, plan for diet, recheck levels in 6 months. Has not had checked since that time.  No cholesterol meds.  Lab Results  Component Value Date   CHOL  230 (H) 06/30/2019   HDL 97 06/30/2019   LDLCALC 114 (H) 06/30/2019   TRIG 111 06/30/2019   CHOLHDL 2.4 06/30/2019   Lab Results  Component Value Date   ALT 9 06/30/2019   AST 14 06/30/2019   ALKPHOS 66 06/30/2019   BILITOT 0.5 06/30/2019    History of B12 deficiency with neuropathy Last discussed in December 2020.  300 mg gabapentin at bedtime. Working well at current dose.  And vitamin B12 1000 mcg once per day.  Neurologist Dr. Jaynee Eagles.  Lab Results  Component Value Date   [id number] 06/30/2019   Eczema, psoriasis: Followed by Dr. Jarome Matin. Had been treated for psoriasis for years. Had biopsy and was diagnosed with eczema. Currently treated with Dupixent now -working well as of about 4 weeks ago. Saw derm yesterday, doing well. prior methotraxate, clobetasol, other treatment.  Had some hair loss from scratching scalp.   Nicotine addiction: Cessation discussed in the past, not ready to quit at her December 2020 visit. Not ready to quit at this time - slightly less than 1 pack per day.  Patient Active Problem List   Diagnosis Date Noted   Vitamin B12 deficiency neuropathy ([place]) 08/08/2016   Neuropathy 01/04/2016   History of basal cell carcinoma 11/18/2012   Hypertension 02/06/2011   Tobacco use disorder 02/06/2011   Past Medical History:  Diagnosis Date   Arthritis    Basal cell carcinoma of anterior chest    shave bx - 08/12/12 - Dr. Wilhemina Bonito.   Chest pain    Cough    Dizziness -  light-headed    Headache    Hypertension    Palpitations    Tobacco abuse    No ready to quit   Past Surgical History:  Procedure Laterality Date   ELECTROCARDIOGRAM     KNEE ARTHROSCOPY Left    Allergies  Allergen Reactions   Tetracyclines & Related    Codeine Rash   Prior to Admission medications   Medication Sig Start Date End Date Taking? Authorizing Provider  amLODipine (NORVASC) 2.5 MG tablet TAKE 1 TABLET(2.5 MG) BY MOUTH DAILY. NEED OFFICE VISIT FOR FURTHER REFILL  12/06/20  Yes Wendie Agreste, MD  Calcium Carbonate-Vit D-Min (CALTRATE PLUS PO) Take by mouth.     Yes [provider]  DUPIXENT 300 MG/2ML SOPN Inject 300 mg into the skin every 14 (fourteen) days. 02/26/21  Yes [provider]  estradiol (ESTRACE) 0.5 MG tablet Take 0.5 mg by mouth daily.   Yes [provider]  gabapentin (NEURONTIN) 300 MG capsule TAKE 1 CAPSULE(300 MG) BY MOUTH AT BEDTIME 12/06/20  Yes Wendie Agreste, MD  hydrochlorothiazide (MICROZIDE) 12.5 MG capsule TAKE 1 CAPSULE(12.5 MG) BY MOUTH EVERY MORNING. NEED OFFICE VISIT FOR FURTHER REFILL 12/06/20  Yes Wendie Agreste, MD  Progesterone Micronized (PROGESTERONE PO) Take by mouth.   Yes [provider]  vitamin B-12 (CYANOCOBALAMIN) 1000 MCG tablet Take 1,000 mcg by mouth daily.   Yes [provider]   Social History   Socioeconomic History   Marital status: Married    Spouse name: Not on file   Number of children: Not on file   Years of education: Not on file   Highest education level: Not on file  Occupational History   Occupation: Works for Education officer, community    Comment: Health and safety inspector  Tobacco Use   Smoking status: Every Day    Packs/day: 0.50    Types: Cigarettes   Smokeless tobacco: Never  Substance and Sexual Activity   Alcohol use: Yes    Alcohol/week: 2.0 standard drinks    Types: 2 Standard drinks or equivalent per week   Drug use: No   Sexual activity: Yes  Other Topics Concern   Not on file  Social History Narrative   Married. Exercises 5 days per week...speed walking, 3-4 miles   Social Determinants of Health   Financial Resource Strain: Not on file  Food Insecurity: Not on file  Transportation Needs: Not on file  Physical Activity: Not on file  Stress: Not on file  Social Connections: Not on file  Intimate Partner Violence: Not on file    Observations/Objective: There were no vitals filed for this visit.  Nontoxic appearance on video,  normal speech, normal respiratory effort, no distress, euthymic mood.  All questions answered with understanding of plan expressed.  Assessment and Plan: Hyperlipidemia, unspecified hyperlipidemia type - Plan: Comprehensive metabolic panel, Lipid panel  -Check labs, consider statin based on 10-year ASCVD risk score, option of coronary calcium scoring to help decide if needed.  Neuropathy - Plan: gabapentin (NEURONTIN) 300 MG capsule  -Stable with gabapentin, continue same  Essential hypertension - Plan: amLODipine (NORVASC) 2.5 MG tablet, hydrochlorothiazide (MICROZIDE) 12.5 MG capsule, Comprehensive metabolic panel  -Tolerating current regimen, continue same.  Lab only visit tomorrow with nurse visit for blood pressure check.  Cigarette nicotine dependence without complication  -Not ready for cessation at this time.  B12 deficiency - Plan: B12  -Check labs, continue supplement.  Eczema, unspecified type  -Improved now on Dupixent, continue follow-up with  dermatologist.  Follow Up Instructions:  Lab only visit tomorrow morning. I discussed the assessment and treatment plan with the patient. The patient was provided an opportunity to ask questions and all were answered. The patient agreed with the plan and demonstrated an understanding of the instructions.   The patient was advised to call back or seek an in-person evaluation if the symptoms worsen or if the condition fails to improve as anticipated.  Wendie Agreste, MD

## 2021-03-10 ENCOUNTER — Other Ambulatory Visit (INDEPENDENT_AMBULATORY_CARE_PROVIDER_SITE_OTHER): Payer: BC Managed Care – PPO

## 2021-03-10 ENCOUNTER — Other Ambulatory Visit: Payer: Self-pay

## 2021-03-10 DIAGNOSIS — E538 Deficiency of other specified B group vitamins: Secondary | ICD-10-CM | POA: Diagnosis not present

## 2021-03-10 DIAGNOSIS — E785 Hyperlipidemia, unspecified: Secondary | ICD-10-CM

## 2021-03-10 DIAGNOSIS — I10 Essential (primary) hypertension: Secondary | ICD-10-CM

## 2021-03-10 LAB — LIPID PANEL
Cholesterol: 254 mg/dL — ABNORMAL HIGH (ref 0–200)
HDL: 87 mg/dL (ref >39.00)
LDL Cholesterol: 140 mg/dL — ABNORMAL HIGH (ref 0–99)
NonHDL: 167.21
Total CHOL/HDL Ratio: 3
Triglycerides: 135 mg/dL (ref 0.0–149.0)
VLDL: 27 mg/dL (ref 0.0–40.0)

## 2021-03-10 LAB — COMPREHENSIVE METABOLIC PANEL
ALT: 21 U/L (ref 0–35)
AST: 23 U/L (ref 0–37)
Albumin: 4.3 g/dL (ref 3.5–5.2)
Alkaline Phosphatase: 65 U/L (ref 39–117)
BUN: 15 mg/dL (ref 6–23)
CO2: 26 mEq/L (ref 19–32)
Calcium: 9.5 mg/dL (ref 8.4–10.5)
Chloride: 102 mEq/L (ref 96–112)
Creatinine, Ser: 0.72 mg/dL (ref 0.40–1.20)
GFR: 92 mL/min (ref >60.00)
Glucose, Bld: 91 mg/dL (ref 70–99)
Potassium: 4.1 mEq/L (ref 3.5–5.1)
Sodium: 139 mEq/L (ref 135–145)
Total Bilirubin: 1 mg/dL (ref 0.2–1.2)
Total Protein: 6.6 g/dL (ref 6.0–8.3)

## 2021-03-10 LAB — VITAMIN B12: Vitamin B-12: 627 pg/mL (ref 211–911)

## 2021-05-11 DIAGNOSIS — Z85828 Personal history of other malignant neoplasm of skin: Secondary | ICD-10-CM | POA: Diagnosis not present

## 2021-05-11 DIAGNOSIS — L2089 Other atopic dermatitis: Secondary | ICD-10-CM | POA: Diagnosis not present

## 2021-06-09 ENCOUNTER — Ambulatory Visit: Payer: BC Managed Care – PPO | Admitting: Family Medicine

## 2021-06-09 VITALS — BP 132/76 | HR 74 | Temp 98.0°F | Resp 16 | Ht 65.0 in | Wt 112.4 lb

## 2021-06-09 DIAGNOSIS — Z23 Encounter for immunization: Secondary | ICD-10-CM | POA: Diagnosis not present

## 2021-06-09 DIAGNOSIS — E785 Hyperlipidemia, unspecified: Secondary | ICD-10-CM | POA: Diagnosis not present

## 2021-06-09 DIAGNOSIS — I10 Essential (primary) hypertension: Secondary | ICD-10-CM

## 2021-06-09 DIAGNOSIS — G629 Polyneuropathy, unspecified: Secondary | ICD-10-CM

## 2021-06-09 NOTE — Patient Instructions (Addendum)
No med changes for now. Repeat labs at follow-up in 3 months for physical.  Second shingles vaccine to be given at that time.  If you are able to check your blood pressure outside of here, let me know if it is running above 130/80.  Thanks for coming in today.

## 2021-06-09 NOTE — Progress Notes (Signed)
Subjective:  Patient ID: Kellie Mason, female    DOB: 09-17-1960  Age: 60 y.o. MRN: [id number]  CC:  Chief Complaint  Patient presents with   Hypertension    Pt here for recheck, no concerns    Immunizations    Pt is due for shingrix, pneumonia and flu shot but would like to review as she takes dupixent, and has concerns     HPI Kellie Mason presents for   Hypertension: Hydrochlorothiazide 12.5 mg daily.  Amlodipine 2.5 mg daily.no new side effects.  Home readings:none.  BP Readings from Last 3 Encounters:  06/09/21 132/76  06/11/20 121/80  06/01/20 (!) 153/94   Lab Results  Component Value Date   CREATININE 0.72 03/10/2021   History of B12 deficiency with peripheral neuropathy. Gabapentin 300 mg nightly.  Vitamin B12 1000 mcg once per day, neurologist Dr. Jaynee Eagles.  Well controlled on current meds.   Immunization counseling Due for Shingrix, pneumonia vaccine, flu vaccine. - declines pneumonia vaccine at this time.  She is on Dupixent with history of eczema, prior treatment for psoriasis. Dermatologist Dr. Jarome Matin. Immunization History  Administered Date(s) Administered   Influenza,inj,Quad PF,6+ Mos 05/22/2017, 06/11/2020   Moderna Sars-Covid-2 Vaccination 10/14/2019, 11/13/2019   Tdap 11/18/2012    Hyperlipidemia: Elevated readings August, but 10-year ASCVD risk score of 8%.  Option of statin versus diet/exercise change. Has cut back some on sugar.   Lab Results  Component Value Date   CHOL 254 (H) 03/10/2021   HDL 87.00 03/10/2021   LDLCALC 140 (H) 03/10/2021   TRIG 135.0 03/10/2021   CHOLHDL 3 03/10/2021   Lab Results  Component Value Date   ALT 21 03/10/2021   AST 23 03/10/2021   ALKPHOS 65 03/10/2021   BILITOT 1.0 03/10/2021      History Patient Active Problem List   Diagnosis Date Noted   Vitamin B12 deficiency neuropathy ([place]) 08/08/2016   Neuropathy 01/04/2016   History of basal cell carcinoma 11/18/2012   Hypertension 02/06/2011    Tobacco use disorder 02/06/2011   Past Medical History:  Diagnosis Date   Arthritis    Basal cell carcinoma of anterior chest    shave bx - 08/12/12 - Dr. Wilhemina Bonito.   Chest pain    Cough    Dizziness - light-headed    Headache    Hypertension    Palpitations    Tobacco abuse    No ready to quit   Past Surgical History:  Procedure Laterality Date   ELECTROCARDIOGRAM     KNEE ARTHROSCOPY Left    Allergies  Allergen Reactions   Tetracyclines & Related    Codeine Rash   Prior to Admission medications   Medication Sig Start Date End Date Taking? Authorizing Provider  amLODipine (NORVASC) 2.5 MG tablet TAKE 1 TABLET(2.5 MG) BY MOUTH DAILY. 03/09/21  Yes Wendie Agreste, MD  Calcium Carbonate-Vit D-Min (CALTRATE PLUS PO) Take by mouth.     Yes [provider]  DUPIXENT 300 MG/2ML SOPN Inject 300 mg into the skin every 14 (fourteen) days. 02/26/21  Yes [provider]  estradiol (ESTRACE) 0.5 MG tablet Take 0.5 mg by mouth daily.   Yes [provider]  gabapentin (NEURONTIN) 300 MG capsule TAKE 1 CAPSULE(300 MG) BY MOUTH AT BEDTIME 03/09/21  Yes Wendie Agreste, MD  hydrochlorothiazide (MICROZIDE) 12.5 MG capsule TAKE 1 CAPSULE(12.5 MG) BY MOUTH EVERY MORNING. 03/09/21  Yes Wendie Agreste, MD  Multiple Vitamins-Minerals (MULTIVITAMIN ADULT EXTRA C  PO) Take by mouth.   Yes [provider]  Omega-3 Fatty Acids (FISH OIL) 1000 MG CAPS Take by mouth.   Yes [provider]  Progesterone Micronized (PROGESTERONE PO) Take by mouth.   Yes [provider]  vitamin B-12 (CYANOCOBALAMIN) 1000 MCG tablet Take 1,000 mcg by mouth daily.   Yes [provider]   Social History   Socioeconomic History   Marital status: Married    Spouse name: Not on file   Number of children: Not on file   Years of education: Not on file   Highest education level: Not on file  Occupational History   Occupation: Works for Education officer, community     Comment: Health and safety inspector  Tobacco Use   Smoking status: Every Day    Packs/day: 0.50    Types: Cigarettes   Smokeless tobacco: Never  Substance and Sexual Activity   Alcohol use: Yes    Alcohol/week: 2.0 standard drinks    Types: 2 Standard drinks or equivalent per week   Drug use: No   Sexual activity: Yes  Other Topics Concern   Not on file  Social History Narrative   Married. Exercises 5 days per week...speed walking, 3-4 miles   Social Determinants of Health   Financial Resource Strain: Not on file  Food Insecurity: Not on file  Transportation Needs: Not on file  Physical Activity: Not on file  Stress: Not on file  Social Connections: Not on file  Intimate Partner Violence: Not on file    Review of Systems  Constitutional:  Negative for fatigue and unexpected weight change.  Respiratory:  Negative for chest tightness and shortness of breath.   Cardiovascular:  Negative for chest pain, palpitations and leg swelling.  Gastrointestinal:  Negative for abdominal pain and blood in stool.  Neurological:  Negative for dizziness, syncope, light-headedness and headaches.    Objective:   Vitals:   06/09/21 1551  BP: 132/76  Pulse: 74  Resp: 16  Temp: 98 F (36.7 C)  TempSrc: Temporal  SpO2: 97%  Weight: 112 lb 6.4 oz (51 kg)  Height: 5\' 5"  (1.651 m)     Physical Exam Vitals reviewed.  Constitutional:      Appearance: Normal appearance. She is well-developed.  HENT:     Head: Normocephalic and atraumatic.  Eyes:     Conjunctiva/sclera: Conjunctivae normal.     Pupils: Pupils are equal, round, and reactive to light.  Neck:     Vascular: No carotid bruit.  Cardiovascular:     Rate and Rhythm: Normal rate and regular rhythm.     Heart sounds: Normal heart sounds.  Pulmonary:     Effort: Pulmonary effort is normal.     Breath sounds: Normal breath sounds.  Abdominal:     Palpations: Abdomen is soft. There is no pulsatile mass.     Tenderness: There is no  abdominal tenderness.  Musculoskeletal:     Right lower leg: No edema.     Left lower leg: No edema.  Skin:    General: Skin is warm and dry.  Neurological:     Mental Status: She is alert and oriented to person, place, and time.  Psychiatric:        Mood and Affect: Mood normal.        Behavior: Behavior normal.       Assessment & Plan:  Kellie Mason is a 60 y.o. female . Essential hypertension  -Borderline at 132/76, but will continue same regimen.  Recheck 3 months  Neuropathy Overall stable, continue B12 supplement, gabapentin.  No changes.  Hyperlipidemia, unspecified hyperlipidemia type Plan for recheck in 3 months for physical, diet/exercise approach.  Needs flu shot - Plan: Flu Vaccine QUAD 76mo+IM (Fluarix, Fluzone & Alfiuria Quad PF)  Need for shingles vaccine - Plan: Varicella-zoster vaccine IM  Pneumonia vaccination discussed, declined at this time.  No orders of the defined types were placed in this encounter.  Patient Instructions  No med changes for now. Repeat labs at follow-up in 3 months for physical.  Second shingles vaccine to be given at that time.  If you are able to check your blood pressure outside of here, let me know if it is running above 130/80.  Thanks for coming in today.     Signed,   [name], MD [place] 06/09/21 4:36 PM

## 2021-06-27 DIAGNOSIS — C44529 Squamous cell carcinoma of skin of other part of trunk: Secondary | ICD-10-CM | POA: Diagnosis not present

## 2021-06-27 DIAGNOSIS — L570 Actinic keratosis: Secondary | ICD-10-CM | POA: Diagnosis not present

## 2021-09-06 ENCOUNTER — Other Ambulatory Visit: Payer: Self-pay | Admitting: Family Medicine

## 2021-09-06 DIAGNOSIS — I10 Essential (primary) hypertension: Secondary | ICD-10-CM

## 2021-09-06 DIAGNOSIS — G629 Polyneuropathy, unspecified: Secondary | ICD-10-CM

## 2021-09-06 NOTE — Telephone Encounter (Signed)
Patient is requesting a refill of the following medications: Requested Prescriptions   Pending Prescriptions Disp Refills   amLODipine (NORVASC) 2.5 MG tablet [Pharmacy Med Name: AMLODIPINE BESYLATE 2.5MG  TABLETS] 90 tablet 1    Sig: TAKE 1 TABLET(2.5 MG) BY MOUTH DAILY.   hydrochlorothiazide (MICROZIDE) 12.5 MG capsule [Pharmacy Med Name: HYDROCHLOROTHIAZIDE 12.5MG  CAPSULES] 90 capsule 1    Sig: TAKE 1 CAPSULE(12.5 MG) BY MOUTH EVERY MORNING.   gabapentin (NEURONTIN) 300 MG capsule [Pharmacy Med Name: GABAPENTIN 300MG  CAPSULES] 90 capsule 1    Sig: TAKE 1 CAPSULE(300 MG) BY MOUTH AT BEDTIME    Date of patient request: 09/06/2021 Last office visit: 06/09/21 Date of last refill: 03/09/2021 Last refill amount: 90 x1R   Follow up time period per chart: 3 months physical

## 2021-10-06 ENCOUNTER — Ambulatory Visit (INDEPENDENT_AMBULATORY_CARE_PROVIDER_SITE_OTHER): Payer: 59 | Admitting: Family Medicine

## 2021-10-06 VITALS — BP 122/72 | HR 84 | Temp 98.1°F | Wt 116.4 lb

## 2021-10-06 DIAGNOSIS — G629 Polyneuropathy, unspecified: Secondary | ICD-10-CM

## 2021-10-06 DIAGNOSIS — E785 Hyperlipidemia, unspecified: Secondary | ICD-10-CM

## 2021-10-06 DIAGNOSIS — Z23 Encounter for immunization: Secondary | ICD-10-CM

## 2021-10-06 DIAGNOSIS — I10 Essential (primary) hypertension: Secondary | ICD-10-CM | POA: Diagnosis not present

## 2021-10-06 DIAGNOSIS — Z131 Encounter for screening for diabetes mellitus: Secondary | ICD-10-CM | POA: Diagnosis not present

## 2021-10-06 DIAGNOSIS — Z0000 Encounter for general adult medical examination without abnormal findings: Secondary | ICD-10-CM | POA: Diagnosis not present

## 2021-10-06 DIAGNOSIS — F17210 Nicotine dependence, cigarettes, uncomplicated: Secondary | ICD-10-CM

## 2021-10-06 MED ORDER — HYDROCHLOROTHIAZIDE 12.5 MG PO CAPS: ORAL_CAPSULE | ORAL | 3 refills | Status: DC

## 2021-10-06 MED ORDER — GABAPENTIN 300 MG PO CAPS: ORAL_CAPSULE | ORAL | 3 refills | Status: DC

## 2021-10-06 MED ORDER — AMLODIPINE BESYLATE 2.5 MG PO TABS: ORAL_TABLET | ORAL | 3 refills | Status: AC

## 2021-10-06 NOTE — Patient Instructions (Addendum)
If you want to establish with new eye provider, here are a few recommendations: ?Dr. Jerline Pain at [facility], Margot Ables, Nira Conn [place] [facility].  ?No med changes for now.  ?If any concerns on labs I will let you know.  ?Can try some stretches for the side of the hips/hip tightness, but follow-up if continued discomfort or worsening symptoms. ?Thanks for coming in today.  Let me know if there are questions.  ? ?Preventive Care 60-60 Years Old, Female ?Preventive care refers to lifestyle choices and visits with your health care provider that can promote health and wellness. Preventive care visits are also called wellness exams. ?What can I expect for my preventive care visit? ?Counseling ?Your health care provider may ask you questions about your: ?Medical history, including: ?Past medical problems. ?Family medical history. ?Pregnancy history. ?Current health, including: ?Menstrual cycle. ?Method of birth control. ?Emotional well-being. ?Home life and relationship well-being. ?Sexual activity and sexual health. ?Lifestyle, including: ?Alcohol, nicotine or tobacco, and drug use. ?Access to firearms. ?Diet, exercise, and sleep habits. ?Work and work Statistician. ?Sunscreen use. ?Safety issues such as seatbelt and bike helmet use. ?Physical exam ?Your health care provider will check your: ?Height and weight. These may be used to calculate your BMI (body mass index). BMI is a measurement that tells if you are at a healthy weight. ?Waist circumference. This measures the distance around your waistline. This measurement also tells if you are at a healthy weight and may help predict your risk of certain diseases, such as type 2 diabetes and high blood pressure. ?Heart rate and blood pressure. ?Body temperature. ?Skin for abnormal spots. ?What immunizations do I need? ?Vaccines are usually given at various ages, according to a schedule. Your health care provider will recommend vaccines for you based on your age,  medical history, and lifestyle or other factors, such as travel or where you work. ?What tests do I need? ?Screening ?Your health care provider may recommend screening tests for certain conditions. This may include: ?Lipid and cholesterol levels. ?Diabetes screening. This is done by checking your blood sugar (glucose) after you have not eaten for a while (fasting). ?Pelvic exam and Pap test. ?Hepatitis B test. ?Hepatitis C test. ?HIV (human immunodeficiency virus) test. ?STI (sexually transmitted infection) testing, if you are at risk. ?Lung cancer screening. ?Colorectal cancer screening. ?Mammogram. Talk with your health care provider about when you should start having regular mammograms. This may depend on whether you have a family history of breast cancer. ?BRCA-related cancer screening. This may be done if you have a family history of breast, ovarian, tubal, or peritoneal cancers. ?Bone density scan. This is done to screen for osteoporosis. ?Talk with your health care provider about your test results, treatment options, and if necessary, the need for more tests. ?Follow these instructions at home: ?Eating and drinking ? ?Eat a diet that includes fresh fruits and vegetables, whole grains, lean protein, and low-fat dairy products. ?Take vitamin and mineral supplements as recommended by your health care provider. ?Do not drink alcohol if: ?Your health care provider tells you not to drink. ?You are pregnant, may be pregnant, or are planning to become pregnant. ?If you drink alcohol: ?Limit how much you have to 0-1 drink a day. ?Know how much alcohol is in your drink. In the [place] one drink equals one 12 oz bottle of beer (355 mL), one 5 oz glass of wine (148 mL), or one 1? oz glass of hard liquor (44 mL). ?Lifestyle ?Brush your  teeth every morning and night with fluoride toothpaste. Floss one time each day. ?Exercise for at least 30 minutes 5 or more days each week. ?Do not use any products that contain nicotine or  tobacco. These products include cigarettes, chewing tobacco, and vaping devices, such as e-cigarettes. If you need help quitting, ask your health care provider. ?Do not use drugs. ?If you are sexually active, practice safe sex. Use a condom or other form of protection to prevent STIs. ?If you do not wish to become pregnant, use a form of birth control. If you plan to become pregnant, see your health care provider for a prepregnancy visit. ?Take aspirin only as told by your health care provider. Make sure that you understand how much to take and what form to take. Work with your health care provider to find out whether it is safe and beneficial for you to take aspirin daily. ?Find healthy ways to manage stress, such as: ?Meditation, yoga, or listening to music. ?Journaling. ?Talking to a trusted person. ?Spending time with friends and family. ?Minimize exposure to UV radiation to reduce your risk of skin cancer. ?Safety ?Always wear your seat belt while driving or riding in a vehicle. ?Do not drive: ?If you have been drinking alcohol. Do not ride with someone who has been drinking. ?When you are tired or distracted. ?While texting. ?If you have been using any mind-altering substances or drugs. ?Wear a helmet and other protective equipment during sports activities. ?If you have firearms in your house, make sure you follow all gun safety procedures. ?Seek help if you have been physically or sexually abused. ?What's next? ?Visit your health care provider once a year for an annual wellness visit. ?Ask your health care provider how often you should have your eyes and teeth checked. ?Stay up to date on all vaccines. ?This information is not intended to replace advice given to you by your health care provider. Make sure you discuss any questions you have with your health care provider. ?Document Revised: 01/05/2021 Document Reviewed: 01/05/2021 ?Elsevier Patient Education ? 2022 [place]. ? ? ?

## 2021-10-06 NOTE — Progress Notes (Signed)
? ?Subjective:  ?Patient ID: Kellie Mason, female    DOB: 1961-05-24  Age: 61 y.o. MRN: [id number] ? ?CC:  ?Chief Complaint  ?Patient presents with  ? Annual Exam  ?  Will need her 2nd shingles dose today  ?Has appoint with physician for women for pap, mammogram, and bone density in May    ? ? ?HPI ?Kellie Mason presents for  ? ?Annual physical exam: ? ?Chronic meds reviewed at her November 17 visit for hypertension, hyperlipidemia, B12 deficiency and peripheral neuropathy.  Followed by neurology.  Dr. Jaynee Eagles ?History of eczema, previously treated for psoriasis.  On Dupixent, followed by Dr. Jarome Matin. ? ?Cancer screening ?Followed by GYN, Dr. Matthew Saras, plan for Pap, mammogram, bone density at appointment in May. ?Colonoscopy8/29/2018 Dr. Silverio Decamp, diverticulosis, internal hemorrhoids. ? ?Immunization History  ?Administered Date(s) Administered  ? Influenza,inj,Quad PF,6+ Mos 05/22/2017, 06/11/2020, 06/09/2021  ? Moderna Sars-Covid-2 Vaccination 10/14/2019, 11/13/2019  ? Tdap 11/18/2012  ? Zoster Recombinat (Shingrix) 06/09/2021  ?Second Shingrix vaccine given today. ? ?Vision Screening  ? Right eye Left eye Both eyes  ?Without correction '20/10 20/10 20/15 '$  ?With correction     ?Ophthalmology: no recent visit. Prior Lasik. Vision. ? ?Dental: every 6month. [facility] ? ?Alcohol:few drinks per week.  ? ?Tobacco: Half pack per day smoker, cigarettes.  Past 39 years.  ?Cessation has been discussed previously. Not ready to quit now. Will discuss lung CA screening with low dose CT after next year (as 20 pack yr hx at that time).  ? ?Cutting back on sweets for elevated cholesterol. No FH of early CAD. No current statin.  ?The 10-year ASCVD risk score (Arnett DK, et al., 2019) is: 6.8% ?  Values used to calculate the score: ?    Age: [age]years ?    Sex: Female ?    Is Non-Hispanic African American: No ?    Diabetic: No ?    Tobacco smoker: Yes ?    Systolic Blood Pressure: 1825mmHg ?    Is BP treated: Yes ?    HDL  Cholesterol: 87 mg/dL ?    Total Cholesterol: 254 mg/dL ? ?Exercise: ?Physical job with cleaning houses.  ? ? ?History ?Patient Active Problem List  ? Diagnosis Date Noted  ? [place] B12 deficiency neuropathy [place] 08/08/2016  ? Neuropathy 01/04/2016  ? History of basal cell carcinoma 11/18/2012  ? Hypertension 02/06/2011  ? Tobacco use disorder 02/06/2011  ? ?Past Medical History:  ?Diagnosis Date  ? Arthritis   ? Basal cell carcinoma of anterior chest   ? shave bx - 08/12/12 - Dr. DWilhemina Bonito  ? Chest pain   ? Cough   ? Dizziness - light-headed   ? Headache   ? Hypertension   ? Palpitations   ? Tobacco abuse   ? No ready to quit  ? ?Past Surgical History:  ?Procedure Laterality Date  ? ELECTROCARDIOGRAM    ? KNEE ARTHROSCOPY Left   ? ?Allergies  ?Allergen Reactions  ? Tetracyclines & Related   ? Codeine Rash  ? ?Prior to Admission medications   ?Medication Sig Start Date End Date Taking? Authorizing Provider  ?amLODipine (NORVASC) 2.5 MG tablet TAKE 1 TABLET(2.5 MG) BY MOUTH DAILY 09/06/21  Yes GWendie Agreste MD  ?Calcium Carbonate-Vit D-Min (CALTRATE PLUS PO) Take by mouth.     Yes [provider]  ?DUPIXENT 300 MG/2ML SOPN Inject 300 mg into the skin every 14 (fourteen) days. 02/26/21  Yes [provider]  ?  estradiol (ESTRACE) 0.5 MG tablet Take 0.5 mg by mouth daily.   Yes [provider]  ?gabapentin (NEURONTIN) 300 MG capsule TAKE 1 CAPSULE(300 MG) BY MOUTH AT BEDTIME 09/06/21  Yes Wendie Agreste, MD  ?hydrochlorothiazide (MICROZIDE) 12.5 MG capsule TAKE 1 CAPSULE(12.5 MG) BY MOUTH EVERY MORNING 09/06/21  Yes Wendie Agreste, MD  ?Multiple Vitamins-Minerals (MULTIVITAMIN ADULT EXTRA C PO) Take by mouth.   Yes [provider]  ?Omega-3 Fatty Acids (FISH OIL) 1000 MG CAPS Take by mouth.   Yes [provider]  ?Progesterone Micronized (PROGESTERONE PO) Take by mouth.   Yes [provider]  ?vitamin B-12 (CYANOCOBALAMIN) 1000 MCG tablet Take 1,000 mcg by  mouth daily.   Yes [provider]  ? ?Social History  ? ?Socioeconomic History  ? Marital status: Married  ?  Spouse name: Not on file  ? Number of children: Not on file  ? Years of education: Not on file  ? Highest education level: Not on file  ?Occupational History  ? 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 Smoking status: Every Day  ?  Packs/day: 0.50  ?  Types: Cigarettes  ? Smokeless tobacco: Never  ?Substance and Sexual Activity  ? Alcohol use: Yes  ?  Alcohol/week: 2.0 standard drinks  ?  Types: 2 Standard drinks or equivalent per week  ? Drug use: No  ? Sexual activity: Yes  ?Other Topics Concern  ? Not on file  ?Social History Narrative  ? Married. Exercises 5 days per week...speed walking, 3-4 miles  ? ?Social Determinants of Health  ? ?Financial Resource Strain: Not on file  ?Food Insecurity: Not on file  ?Transportation Needs: Not on file  ?Physical Activity: Not on file  ?Stress: Not on file  ?Social Connections: Not on file  ?Intimate Partner Violence: Not on file  ? ? ?Review of Systems ?13 point review of systems per patient health survey noted.  Negative other than as indicated above or in HPI.  ? ?Objective:  ? ?Vitals:  ? 10/06/21 1409  ?BP: 122/72  ?Pulse: 84  ?Temp: 98.1 ?F (36.7 ?C)  ?TempSrc: Temporal  ?SpO2: 98%  ?Weight: 116 lb 6.4 oz (52.8 kg)  ? ? ?Physical Exam ?Constitutional:   ?   Appearance: She is well-developed.  ?HENT:  ?   Head: Normocephalic and atraumatic.  ?   Right Ear: External ear normal.  ?   Left Ear: External ear normal.  ?Eyes:  ?   Conjunctiva/sclera: Conjunctivae normal.  ?   Pupils: Pupils are equal, round, and reactive to light.  ?Neck:  ?   Thyroid: No thyromegaly.  ?Cardiovascular:  ?   Rate and Rhythm: Normal rate and regular rhythm.  ?   Heart sounds: Normal heart sounds. No murmur heard. ?Pulmonary:  ?   Effort: Pulmonary effort is normal. No respiratory distress.  ?   Breath sounds: Normal breath sounds.  No wheezing.  ?Abdominal:  ?   General: Bowel sounds are normal.  ?   Palpations: Abdomen is soft.  ?   Tenderness: There is no abdominal tenderness.  ?Musculoskeletal:     ?   General: No tenderness. Normal range of motion.  ?   Cervical back: Normal range of motion and neck supple.  ?   Comments: Minimal discomfort over trochanteric bursa bilaterally.  Ambulating without difficulty.  IT band stretches discussed.  ?Lymphadenopathy:  ?   Cervical: No cervical adenopathy.  ?  Skin: ?   General: Skin is warm and dry.  ?   Findings: No rash.  ?Neurological:  ?   Mental Status: She is alert and oriented to person, place, and time.  ?Psychiatric:     ?   Behavior: Behavior normal.     ?   Thought Content: Thought content normal.  ? ? ? ? ? ?Assessment & Plan:  ?LATRENDA IRANI is a 61 y.o. female . ?Annual physical exam - Plan: Comprehensive metabolic panel, Lipid panel, Hemoglobin A1c ? - -anticipatory guidance as below in AVS, screening labs above. Health maintenance items as above in HPI discussed/recommended as applicable.  ? ?Essential hypertension - Plan: Comprehensive metabolic panel ? -  Stable, tolerating current regimen. Medications refilled. Labs pending as above.  ? ?Need for shingles vaccine - Plan: Comprehensive metabolic panel, Varicella-zoster vaccine IM ? ?Hyperlipidemia, unspecified hyperlipidemia type - Plan: Comprehensive metabolic panel, Lipid panel ? -  low ascvd risk score. Repeat labs. No current statin, continue to watch diet.  ? ?Screening for diabetes mellitus - Plan: Hemoglobin A1c ? ?Not ready to quit smoking, consider low dose lung CT next year - will discuss at follow up. She deferred conversation at this time.  ? ?No orders of the defined types were placed in this encounter. ? ?Patient Instructions  ?If you want to establish with new eye provider, here are a few recommendations: ?Dr. Jerline Pain at [facility], Margot Ables, Nira Conn [place] [facility].  ?No med changes for now.  ?If any  concerns on labs I will let you know.  ?Can try some stretches for the side of the hips/hip tightness, but follow-up if continued discomfort or worsening symptoms. ?Thanks for coming in today.  Let me know if

## 2021-10-07 ENCOUNTER — Encounter: Payer: Self-pay | Admitting: Family Medicine

## 2021-10-07 LAB — LIPID PANEL
Cholesterol: 252 mg/dL — ABNORMAL HIGH (ref 0–200)
HDL: 92.7 mg/dL (ref >39.00)
LDL Cholesterol: 132 mg/dL — ABNORMAL HIGH (ref 0–99)
NonHDL: 159.71
Total CHOL/HDL Ratio: 3
Triglycerides: 138 mg/dL (ref 0.0–149.0)
VLDL: 27.6 mg/dL (ref 0.0–40.0)

## 2021-10-07 LAB — HEMOGLOBIN A1C: Hgb A1c MFr Bld: 5.7 % (ref 4.6–6.5)

## 2021-10-07 LAB — COMPREHENSIVE METABOLIC PANEL
ALT: 21 U/L (ref 0–35)
AST: 24 U/L (ref 0–37)
Albumin: 4.7 g/dL (ref 3.5–5.2)
Alkaline Phosphatase: 57 U/L (ref 39–117)
BUN: 17 mg/dL (ref 6–23)
CO2: 26 mEq/L (ref 19–32)
Calcium: 9.7 mg/dL (ref 8.4–10.5)
Chloride: 101 mEq/L (ref 96–112)
Creatinine, Ser: 0.69 mg/dL (ref 0.40–1.20)
GFR: 95.11 mL/min (ref >60.00)
Glucose, Bld: 84 mg/dL (ref 70–99)
Potassium: 4.1 mEq/L (ref 3.5–5.1)
Sodium: 136 mEq/L (ref 135–145)
Total Bilirubin: 0.7 mg/dL (ref 0.2–1.2)
Total Protein: 7 g/dL (ref 6.0–8.3)

## 2022-02-27 ENCOUNTER — Encounter: Payer: Self-pay | Admitting: Family Medicine

## 2022-02-27 ENCOUNTER — Telehealth: Payer: Self-pay | Admitting: Family Medicine

## 2022-02-27 ENCOUNTER — Telehealth (INDEPENDENT_AMBULATORY_CARE_PROVIDER_SITE_OTHER): Payer: No Typology Code available for payment source | Admitting: Family Medicine

## 2022-02-27 VITALS — Temp 98.6°F | Ht 65.0 in | Wt 111.5 lb

## 2022-02-27 DIAGNOSIS — R052 Subacute cough: Secondary | ICD-10-CM | POA: Diagnosis not present

## 2022-02-27 DIAGNOSIS — R509 Fever, unspecified: Secondary | ICD-10-CM | POA: Diagnosis not present

## 2022-02-27 DIAGNOSIS — R0981 Nasal congestion: Secondary | ICD-10-CM | POA: Diagnosis not present

## 2022-02-27 MED ORDER — BENZONATATE 100 MG PO CAPS: 100.0000 mg | ORAL_CAPSULE | Freq: Three times a day (TID) | ORAL | 0 refills | Status: DC | PRN

## 2022-02-27 MED ORDER — AZITHROMYCIN 250 MG PO TABS: ORAL_TABLET | ORAL | 0 refills | Status: AC

## 2022-02-27 NOTE — Progress Notes (Signed)
Virtual Visit via Video Note  I connected with [name] on 02/27/22 at 4:28 PM by a video enabled telemedicine application and verified that I am speaking with the correct person using two identifiers.  Patient location: home, by self. My location: office - [place].    I discussed the limitations, risks, security and privacy concerns of performing an evaluation and management service by telephone and the availability of in person appointments. I also discussed with the patient that there may be a patient responsible charge related to this service. The patient expressed understanding and agreed to proceed, consent obtained  Chief complaint:  Chief Complaint  Patient presents with   Nasal Congestion    Pt states she had a fever a week ago of 102.0 pt states no nausea or vomiting , did not test for Covid or the flu pt states having weakness but cleaned her house today and did fine     History of Present Illness: FIDENCIA MCCLOUD is a 61 y.o. female  Fever, nasal congestion Initial symptoms started about 2 weeks ago. Cough, congestion. No covid testing. No covid or other sick contacts. Progressive congestion then woke up with fever, chills, fever of 102 8 days ago. Fever resolved next day. Coughing fits with chest congestion, yellow green phlegm persists. No vomiting. No cough syncope. Decreased appetite. Able to sleep.  No chest pains. No dyspnea. No wheezing.  Drinking fluids.   Tx: coricidin, theraflu, mucinex.       COVID-vaccine in 2021, booster Immunization History  Administered Date(s) Administered   Influenza,inj,Quad PF,6+ Mos 05/22/2017, 06/11/2020, 06/09/2021   Moderna Sars-Covid-2 Vaccination 10/14/2019, 11/13/2019   Tdap 11/18/2012   Zoster Recombinat (Shingrix) 06/09/2021, 10/06/2021      Patient Active Problem List   Diagnosis Date Noted   Vitamin B12 deficiency neuropathy ([place]) 08/08/2016   Neuropathy 01/04/2016   History of basal cell  carcinoma 11/18/2012   Hypertension 02/06/2011   Tobacco use disorder 02/06/2011   Past Medical History:  Diagnosis Date   Arthritis    Basal cell carcinoma of anterior chest    shave bx - 08/12/12 - Dr. Wilhemina Bonito.   Chest pain    Cough    Dizziness - light-headed    Headache    Hypertension    Palpitations    Tobacco abuse    No ready to quit   Past Surgical History:  Procedure Laterality Date   ELECTROCARDIOGRAM     KNEE ARTHROSCOPY Left    Allergies  Allergen Reactions   Tetracyclines & Related    Codeine Rash   Prior to Admission medications   Medication Sig Start Date End Date Taking? Authorizing Provider  amLODipine (NORVASC) 2.5 MG tablet TAKE 1 TABLET(2.5 MG) BY MOUTH DAILY 10/06/21  Yes Wendie Agreste, MD  Calcium Carbonate-Vit D-Min (CALTRATE PLUS PO) Take by mouth.     Yes [provider]  DUPIXENT 300 MG/2ML SOPN Inject 300 mg into the skin every 14 (fourteen) days. 02/26/21  Yes [provider]  estradiol (ESTRACE) 0.5 MG tablet Take 0.5 mg by mouth daily.   Yes [provider]  gabapentin (NEURONTIN) 300 MG capsule TAKE 1 CAPSULE(300 MG) BY MOUTH AT BEDTIME 10/06/21  Yes Wendie Agreste, MD  hydrochlorothiazide (MICROZIDE) 12.5 MG capsule TAKE 1 CAPSULE(12.5 MG) BY MOUTH EVERY MORNING 10/06/21  Yes Wendie Agreste, MD  Multiple Vitamins-Minerals (MULTIVITAMIN ADULT EXTRA C PO) Take by mouth.   Yes [provider]  Progesterone Micronized (PROGESTERONE  PO) Take by mouth.   Yes [provider]  vitamin B-12 (CYANOCOBALAMIN) 1000 MCG tablet Take 1,000 mcg by mouth daily.   Yes [provider]  Omega-3 Fatty Acids (FISH OIL) 1000 MG CAPS Take by mouth. Patient not taking: Reported on 02/27/2022    [provider]   Social History   Socioeconomic History   Marital status: Married    Spouse name: Not on file   Number of children: Not on file   Years of education: Not on file   Highest education  level: Not on file  Occupational History   Occupation: Works for Education officer, community    Comment: Health and safety inspector  Tobacco Use   Smoking status: Every Day    Packs/day: 0.50    Types: Cigarettes   Smokeless tobacco: Never  Substance and Sexual Activity   Alcohol use: Yes    Alcohol/week: 2.0 standard drinks of alcohol    Types: 2 Standard drinks or equivalent per week   Drug use: No   Sexual activity: Yes  Other Topics Concern   Not on file  Social History Narrative   Married. Exercises 5 days per week...speed walking, 3-4 miles   Social Determinants of Health   Financial Resource Strain: Not on file  Food Insecurity: Not on file  Transportation Needs: Not on file  Physical Activity: Not on file  Stress: Not on file  Social Connections: Not on file  Intimate Partner Violence: Not on file    Observations/Objective: Vitals:   02/27/22 1554  Temp: 98.6 F (37 C)  Weight: 111 lb 8 oz (50.6 kg)  Height: '5\' 5"'$  (1.651 m)  Speaking in full sentences, no respiratory distress, nontoxic appearance on video.  No audible wheeze.  Coherent responses, all questions answered with understanding of plan expressed   Assessment and Plan: Fever, unspecified fever cause - Plan: azithromycin (ZITHROMAX) 250 MG tablet, benzonatate (TESSALON) 100 MG capsule  Nasal congestion - Plan: azithromycin (ZITHROMAX) 250 MG tablet, benzonatate (TESSALON) 100 MG capsule  Subacute cough - Plan: azithromycin (ZITHROMAX) 250 MG tablet, benzonatate (TESSALON) 100 MG capsule  Initial likely viral illness 2 weeks ago with worsening of symptoms approximately 8 days ago, single day of fever, persistent cough with discolored phlegm.  Bronchitis/LRTI versus community-acquired pneumonia possible.  Afebrile at this time, will initially try outpatient azithromycin, expectorants, cough suppressants discussed including Tessalon Perles.  Update next few days, and if not improving would order chest x-ray, with ER/urgent  care precautions if any new or worsening symptoms.  Did recommend COVID testing and if positive can delay booster temporarily, if not, recommended bivalent booster.   Follow Up Instructions: As needed, ER/urgent care precautions given.  Patient Instructions  I do recommend covid test - let me know if that is positive.  Continue mucinex, or mucinex dm for cough, Tessalon perles if needed.  Start azithromycin, but if cough not improving next few days I want to order an xray. Let me know.   If any worsening symptoms - be seen in person here, urgent care or ER.   Hope you feel better soon.    Cough, Adult Coughing is a reflex that clears your throat and your airways (respiratory system). Coughing helps to heal and protect your lungs. It is normal to cough occasionally, but a cough that happens with other symptoms or lasts a long time may be a sign of a condition that needs treatment. An acute cough may only last 2-3 weeks, while a chronic cough may  last 8 or more weeks. Coughing is commonly caused by: Infection of the respiratory systemby viruses or bacteria. Breathing in substances that irritate your lungs. Allergies. Asthma. Mucus that runs down the back of your throat (postnasal drip). Smoking. Acid backing up from the stomach into the esophagus (gastroesophageal reflux). Certain medicines. Chronic lung problems. Other medical conditions such as heart failure or a blood clot in the lung (pulmonary embolism). Follow these instructions at home: Medicines Take over-the-counter and prescription medicines only as told by your health care provider. Talk with your health care provider before you take a cough suppressant medicine. Lifestyle  Avoid cigarette smoke. Do not use any products that contain nicotine or tobacco, such as cigarettes, e-cigarettes, and chewing tobacco. If you need help quitting, ask your health care provider. Drink enough fluid to keep your urine pale yellow. Avoid  caffeine. Do not drink alcohol if your health care provider tells you not to drink. General instructions  Pay close attention to changes in your cough. Tell your health care provider about them. Always cover your mouth when you cough. Avoid things that make you cough, such as perfume, candles, cleaning products, or campfire or tobacco smoke. If the air is dry, use a cool mist vaporizer or humidifier in your bedroom or your home to help loosen secretions. If your cough is worse at night, try to sleep in a semi-upright position. Rest as needed. Keep all follow-up visits as told by your health care provider. This is important. Contact a health care provider if you: Have new symptoms. Cough up pus. Have a cough that does not get better after 2-3 weeks or gets worse. Cannot control your cough with cough suppressant medicines and you are losing sleep. Have pain that gets worse or pain that is not helped with medicine. Have a fever. Have unexplained weight loss. Have night sweats. Get help right away if: You cough up blood. You have difficulty breathing. Your heartbeat is very fast. These symptoms may represent a serious problem that is an emergency. Do not wait to see if the symptoms will go away. Get medical help right away. Call your local emergency services (911 in the [place] Do not drive yourself to the hospital. Summary Coughing is a reflex that clears your throat and your airways. It is normal to cough occasionally, but a cough that happens with other symptoms or lasts a long time may be a sign of a condition that needs treatment. Take over-the-counter and prescription medicines only as told by your health care provider. Always cover your mouth when you cough. Contact a health care provider if you have new symptoms or a cough that does not get better after 2-3 weeks or gets worse. This information is not intended to replace advice given to you by your health care provider. Make sure you  discuss any questions you have with your health care provider. Document Revised: 07/29/2018 Document Reviewed: 07/29/2018 Elsevier Patient Education  [place].     I discussed the assessment and treatment plan with the patient. The patient was provided an opportunity to ask questions and all were answered. The patient agreed with the plan and demonstrated an understanding of the instructions.   The patient was advised to call back or seek an in-person evaluation if the symptoms worsen or if the condition fails to improve as anticipated.   Wendie Agreste, MD

## 2022-02-27 NOTE — Telephone Encounter (Signed)
Caller name: Kellie Mason (pt)  On DPR? :yes/no: Yes  Call back number: [phone]  Provider they see: Dr. Carlota Raspberry  Reason for call: Congestion for 9 days, had a fever, taking musinex, not helping. Yellow and green mucus; poor appetite. Pt does not want to come in for appointment. Also states that she has diarrhea.

## 2022-02-27 NOTE — Patient Instructions (Addendum)
I do recommend covid test - let me know if that is positive.  Continue mucinex, or mucinex dm for cough, Tessalon perles if needed.  Start azithromycin, but if cough not improving next few days I want to order an xray. Let me know.   If any worsening symptoms - be seen in person here, urgent care or ER.   Hope you feel better soon.    Cough, Adult Coughing is a reflex that clears your throat and your airways (respiratory system). Coughing helps to heal and protect your lungs. It is normal to cough occasionally, but a cough that happens with other symptoms or lasts a long time may be a sign of a condition that needs treatment. An acute cough may only last 2-3 weeks, while a chronic cough may last 8 or more weeks. Coughing is commonly caused by: Infection of the respiratory systemby viruses or bacteria. Breathing in substances that irritate your lungs. Allergies. Asthma. Mucus that runs down the back of your throat (postnasal drip). Smoking. Acid backing up from the stomach into the esophagus (gastroesophageal reflux). Certain medicines. Chronic lung problems. Other medical conditions such as heart failure or a blood clot in the lung (pulmonary embolism). Follow these instructions at home: Medicines Take over-the-counter and prescription medicines only as told by your health care provider. Talk with your health care provider before you take a cough suppressant medicine. Lifestyle  Avoid cigarette smoke. Do not use any products that contain nicotine or tobacco, such as cigarettes, e-cigarettes, and chewing tobacco. If you need help quitting, ask your health care provider. Drink enough fluid to keep your urine pale yellow. Avoid caffeine. Do not drink alcohol if your health care provider tells you not to drink. General instructions  Pay close attention to changes in your cough. Tell your health care provider about them. Always cover your mouth when you cough. Avoid things that make  you cough, such as perfume, candles, cleaning products, or campfire or tobacco smoke. If the air is dry, use a cool mist vaporizer or humidifier in your bedroom or your home to help loosen secretions. If your cough is worse at night, try to sleep in a semi-upright position. Rest as needed. Keep all follow-up visits as told by your health care provider. This is important. Contact a health care provider if you: Have new symptoms. Cough up pus. Have a cough that does not get better after 2-3 weeks or gets worse. Cannot control your cough with cough suppressant medicines and you are losing sleep. Have pain that gets worse or pain that is not helped with medicine. Have a fever. Have unexplained weight loss. Have night sweats. Get help right away if: You cough up blood. You have difficulty breathing. Your heartbeat is very fast. These symptoms may represent a serious problem that is an emergency. Do not wait to see if the symptoms will go away. Get medical help right away. Call your local emergency services (911 in the [place] Do not drive yourself to the hospital. Summary Coughing is a reflex that clears your throat and your airways. It is normal to cough occasionally, but a cough that happens with other symptoms or lasts a long time may be a sign of a condition that needs treatment. Take over-the-counter and prescription medicines only as told by your health care provider. Always cover your mouth when you cough. Contact a health care provider if you have new symptoms or a cough that does not get better after 2-3 weeks or gets  worse. This information is not intended to replace advice given to you by your health care provider. Make sure you discuss any questions you have with your health care provider. Document Revised: 07/29/2018 Document Reviewed: 07/29/2018 Elsevier Patient Education  [place].

## 2022-02-27 NOTE — Telephone Encounter (Signed)
I spoke w/ pt and she agreed to a Virtual apt . I added her to DR Carlota Raspberry at 4 pm today

## 2022-10-06 ENCOUNTER — Other Ambulatory Visit: Payer: Self-pay | Admitting: Family Medicine

## 2022-10-06 DIAGNOSIS — G629 Polyneuropathy, unspecified: Secondary | ICD-10-CM

## 2022-10-06 NOTE — Telephone Encounter (Signed)
History of peripheral neuropathy, discussed at her last physical, March 2023.  1 year follow-up planned.  No concerns on controlled substance database.  Temporary refill provided but due for repeat physical -please schedule

## 2022-10-06 NOTE — Telephone Encounter (Signed)
Gabapentin 300 mg LOV: 02/27/22 Last Refill: Upcoming appt: none

## 2022-10-09 ENCOUNTER — Other Ambulatory Visit: Payer: Self-pay

## 2022-10-09 NOTE — Telephone Encounter (Signed)
Called patient to schedule her for a physical. Patent states that she will call back a later time to schedule her physical.

## 2022-10-11 ENCOUNTER — Telehealth: Payer: Self-pay | Admitting: Family Medicine

## 2022-10-11 NOTE — Telephone Encounter (Signed)
Sent to medical records. 

## 2022-10-11 NOTE — Telephone Encounter (Signed)
Request for medical records placed in front bin.

## 2022-12-12 ENCOUNTER — Encounter: Payer: Self-pay | Admitting: Nurse Practitioner

## 2022-12-28 ENCOUNTER — Other Ambulatory Visit: Payer: Self-pay | Admitting: Family Medicine

## 2022-12-28 DIAGNOSIS — I10 Essential (primary) hypertension: Secondary | ICD-10-CM

## 2023-01-06 ENCOUNTER — Other Ambulatory Visit: Payer: Self-pay | Admitting: Family Medicine

## 2023-01-06 DIAGNOSIS — G629 Polyneuropathy, unspecified: Secondary | ICD-10-CM

## 2023-02-22 ENCOUNTER — Ambulatory Visit: Payer: No Typology Code available for payment source | Admitting: Nurse Practitioner

## 2023-02-22 ENCOUNTER — Encounter: Payer: Self-pay | Admitting: Nurse Practitioner

## 2023-02-22 VITALS — BP 120/70 | HR 68 | Ht 64.5 in | Wt 110.5 lb

## 2023-02-22 DIAGNOSIS — K644 Residual hemorrhoidal skin tags: Secondary | ICD-10-CM

## 2023-02-22 NOTE — Progress Notes (Signed)
Primary GI: Kellie Aris, MD    ASSESSMENT & PLAN   62 y.o. yo female with a past medical history consisting of, but not necessarily limited to IBS, hemorrhoids, anal fissure, HTN.    Perianal burning / small hemorrhoid tags on exam.  -Okay to use baby wipes for cleaning perianal area.  -Advised not to strain. Discussed using glycerin suppositories if needed.  -Given sample of Recti care with phenylephrine to help with the burning and also shrink hemorrhoidal tissue.  -She will monitor and If sees protrusion of internal hemorrhoids we can call in Rx for steroid cream to use internally  IBS with alternating constipation / diarrhea.  Eats high fiber diet   HPI   Brief GI History   Patient was last seen in the office in 2018. She has a history of internal and external hemorrhoids and IBS with alternating bowel habits.   Interval History  Chief complaint : external hemorrhoids burning  Kellie Mason has been having perianal burning at times. No rectal pain. No abdominal pain. She feels hemorrhoids are hanging out. She has loose stools alternating with hard stools which sometimes requires straining. She hasn't had any associated rectal bleeding. She has hemorrhoid cream at home but it is old Rx so not using it. No other GI complaints.    Previous GI Studies   **May not be a complete list of studies  Colonoscopy 2018.  - Diverticulosis in the sigmoid colon. - Bleeding internal hemorrhoids. - Normal mucosa in the entire examined colon. Biopsied.  Path - normal mucosa   Labs    Past Medical History:  Diagnosis Date   Arthritis    Basal cell carcinoma of anterior chest    shave bx - 08/12/12 - Dr. Karlyn Agee.   Chest pain    Cough    Dizziness - light-headed    Headache    Hypertension    Palpitations    Tobacco abuse    No ready to quit   Past Surgical History:  Procedure Laterality Date   ELECTROCARDIOGRAM     KNEE ARTHROSCOPY Left    Family History  Problem  Relation Age of Onset   Hypertension Mother    Cancer Mother    Neuropathy Mother    Hypertension Sister    Cancer Father    Hypertension Maternal Grandfather    Neuropathy Brother    Social History   Tobacco Use   Smoking status: Every Day    Current packs/day: 0.50    Types: Cigarettes   Smokeless tobacco: Never  Substance Use Topics   Alcohol use: Yes    Alcohol/week: 2.0 standard drinks of alcohol    Types: 2 Standard drinks or equivalent per week   Drug use: No   Current Outpatient Medications  Medication Sig Dispense Refill   amLODipine (NORVASC) 2.5 MG tablet TAKE 1 TABLET(2.5 MG) BY MOUTH DAILY 90 tablet 3   benzonatate (TESSALON) 100 MG capsule Take 1 capsule (100 mg total) by mouth 3 (three) times daily as needed for cough. 20 capsule 0   Calcium Carbonate-Vit D-Min (CALTRATE PLUS PO) Take by mouth.       DUPIXENT 300 MG/2ML SOPN Inject 300 mg into the skin every 14 (fourteen) days.     estradiol (ESTRACE) 0.5 MG tablet Take 0.5 mg by mouth daily.     gabapentin (NEURONTIN) 300 MG capsule TAKE 1 CAPSULE(300 MG) BY MOUTH AT BEDTIME 90 capsule 0   hydrochlorothiazide (MICROZIDE) 12.5 MG capsule TAKE 1 CAPSULE(12.5  MG) BY MOUTH EVERY MORNING 90 capsule 3   ketoconazole (NIZORAL) 2 % shampoo ketoconazole 2 % shampoo     Multiple Vitamins-Minerals (MULTIVITAMIN ADULT EXTRA C PO) Take by mouth.     Omega-3 Fatty Acids (FISH OIL) 1000 MG CAPS Take by mouth. (Patient not taking: Reported on 02/27/2022)     Progesterone Micronized (PROGESTERONE PO) Take by mouth.     tacrolimus (PROTOPIC) 0.1 % ointment tacrolimus 0.1 % topical ointment  APPLY TO THE AFFECTED AREA TWICE DAILY     vitamin B-12 (CYANOCOBALAMIN) 1000 MCG tablet Take 1,000 mcg by mouth daily.     No current facility-administered medications for this visit.   Allergies  Allergen Reactions   Tetracyclines & Related    Codeine Rash    Review of Systems: All other systems reviewed and negative except where  noted in HPI.   Wt Readings from Last 3 Encounters:  02/27/22 111 lb 8 oz (50.6 kg)  10/06/21 116 lb 6.4 oz (52.8 kg)  06/09/21 112 lb 6.4 oz (51 kg)    Physical Exam:  BP 120/70 (BP Location: Left Arm, Patient Position: Sitting, Cuff Size: Normal)   Pulse 68   Ht 5' 4.5" (1.638 m) Comment: height measured without shoes  Wt 110 lb 8 oz (50.1 kg)   LMP 07/24/2013   BMI 18.67 kg/m  Constitutional:  Pleasant, generally well appearing female in no acute distress. Psychiatric:  Normal mood and affect. Behavior is normal. EENT: Pupils normal.  Conjunctivae are normal. No scleral icterus. Neck supple.  Cardiovascular: Normal rate, regular rhythm.  Pulmonary/chest: Effort normal and breath sounds normal. No wheezing, rales or rhonchi. Abdominal: Soft, nondistended, nontender. Bowel sounds active throughout. There are no masses palpable. No hepatomegaly. Rectal: a couple of small fleshy hemorrhoid tags. No obvious fissure.  Neurological: Alert and oriented to person place and time.  Willette Cluster, NP  02/22/2023, 9:26 AM

## 2023-02-22 NOTE — Patient Instructions (Signed)
Follow up as needed   Use Recticare as needed  _______________________________________________________  If your blood pressure at your visit was 140/90 or greater, please contact your primary care physician to follow up on this.  _______________________________________________________  If you are age 61 or older, your body mass index should be between 23-30. Your Body mass index is 18.67 kg/m. If this is out of the aforementioned range listed, please consider follow up with your Primary Care Provider.  If you are age 105 or younger, your body mass index should be between 19-25. Your Body mass index is 18.67 kg/m. If this is out of the aformentioned range listed, please consider follow up with your Primary Care Provider.   ________________________________________________________  The [facility] providers would like to encourage you to use [facility] to communicate with providers for non-urgent requests or questions.  Due to long hold times on the telephone, sending your provider a message by [facility] may be a faster and more efficient way to get a response.  Please allow 48 business hours for a response.  Please remember that this is for non-urgent requests.  _______________________________________________________   Thank you for entrusting me with your care and choosing [facility].  Willette Cluster NP

## 2024-01-08 ENCOUNTER — Other Ambulatory Visit: Payer: Self-pay | Admitting: Family Medicine

## 2024-01-08 DIAGNOSIS — I10 Essential (primary) hypertension: Secondary | ICD-10-CM

## 2024-03-23 ENCOUNTER — Other Ambulatory Visit: Payer: Self-pay | Admitting: Family Medicine

## 2024-03-23 DIAGNOSIS — I10 Essential (primary) hypertension: Secondary | ICD-10-CM
